# Patient Record
Sex: Female | Born: 1955 | Race: White | Hispanic: No | Marital: Single | Smoking: Current every day smoker
Health system: Southern US, Community
[De-identification: ages and names within clinical notes are randomized; demographics above are authoritative.]

## PROBLEM LIST (undated history)

## (undated) DIAGNOSIS — M797 Fibromyalgia: Secondary | ICD-10-CM

## (undated) DIAGNOSIS — Z8701 Personal history of pneumonia (recurrent): Secondary | ICD-10-CM

## (undated) DIAGNOSIS — G4700 Insomnia, unspecified: Secondary | ICD-10-CM

## (undated) DIAGNOSIS — F419 Anxiety disorder, unspecified: Secondary | ICD-10-CM

## (undated) DIAGNOSIS — E273 Drug-induced adrenocortical insufficiency: Secondary | ICD-10-CM

## (undated) DIAGNOSIS — K219 Gastro-esophageal reflux disease without esophagitis: Secondary | ICD-10-CM

## (undated) DIAGNOSIS — E039 Hypothyroidism, unspecified: Secondary | ICD-10-CM

## (undated) DIAGNOSIS — E119 Type 2 diabetes mellitus without complications: Secondary | ICD-10-CM

## (undated) DIAGNOSIS — E785 Hyperlipidemia, unspecified: Secondary | ICD-10-CM

## (undated) DIAGNOSIS — I10 Essential (primary) hypertension: Secondary | ICD-10-CM

## (undated) DIAGNOSIS — I2510 Atherosclerotic heart disease of native coronary artery without angina pectoris: Secondary | ICD-10-CM

---

## 1999-02-28 ENCOUNTER — Ambulatory Visit (HOSPITAL_COMMUNITY): Admission: RE | Admit: 1999-02-28 | Discharge: 1999-02-28 | Payer: Self-pay

## 2008-03-07 ENCOUNTER — Encounter: Admission: RE | Admit: 2008-03-07 | Discharge: 2008-03-07 | Payer: Self-pay | Admitting: Family Medicine

## 2008-03-09 ENCOUNTER — Ambulatory Visit (HOSPITAL_COMMUNITY): Admission: RE | Admit: 2008-03-09 | Discharge: 2008-03-09 | Payer: Self-pay | Admitting: Family Medicine

## 2011-01-11 ENCOUNTER — Encounter: Payer: Self-pay | Admitting: Family Medicine

## 2017-02-01 DIAGNOSIS — I10 Essential (primary) hypertension: Secondary | ICD-10-CM | POA: Diagnosis present

## 2017-02-01 DIAGNOSIS — F411 Generalized anxiety disorder: Secondary | ICD-10-CM | POA: Diagnosis present

## 2017-02-01 DIAGNOSIS — E1169 Type 2 diabetes mellitus with other specified complication: Secondary | ICD-10-CM | POA: Diagnosis present

## 2017-03-15 DIAGNOSIS — I739 Peripheral vascular disease, unspecified: Secondary | ICD-10-CM | POA: Diagnosis present

## 2017-04-19 DIAGNOSIS — K219 Gastro-esophageal reflux disease without esophagitis: Secondary | ICD-10-CM

## 2017-05-03 DIAGNOSIS — F5101 Primary insomnia: Secondary | ICD-10-CM | POA: Diagnosis present

## 2017-05-24 ENCOUNTER — Other Ambulatory Visit (HOSPITAL_COMMUNITY): Payer: Self-pay | Admitting: Adult Health Nurse Practitioner

## 2017-05-24 DIAGNOSIS — Z85820 Personal history of malignant melanoma of skin: Secondary | ICD-10-CM | POA: Insufficient documentation

## 2017-05-24 DIAGNOSIS — Z1231 Encounter for screening mammogram for malignant neoplasm of breast: Secondary | ICD-10-CM

## 2017-06-07 ENCOUNTER — Encounter (HOSPITAL_COMMUNITY): Payer: Self-pay

## 2017-06-07 ENCOUNTER — Ambulatory Visit (HOSPITAL_COMMUNITY): Payer: Self-pay

## 2017-09-28 DIAGNOSIS — E785 Hyperlipidemia, unspecified: Secondary | ICD-10-CM | POA: Diagnosis present

## 2017-12-21 DIAGNOSIS — C7951 Secondary malignant neoplasm of bone: Secondary | ICD-10-CM

## 2017-12-21 HISTORY — DX: Secondary malignant neoplasm of bone: C79.51

## 2018-10-06 DIAGNOSIS — C438 Malignant melanoma of overlapping sites of skin: Secondary | ICD-10-CM | POA: Diagnosis present

## 2019-09-08 DIAGNOSIS — E032 Hypothyroidism due to medicaments and other exogenous substances: Secondary | ICD-10-CM | POA: Diagnosis present

## 2020-06-29 DIAGNOSIS — E2740 Unspecified adrenocortical insufficiency: Secondary | ICD-10-CM | POA: Diagnosis present

## 2020-12-21 DIAGNOSIS — I739 Peripheral vascular disease, unspecified: Secondary | ICD-10-CM

## 2020-12-21 DIAGNOSIS — C801 Malignant (primary) neoplasm, unspecified: Secondary | ICD-10-CM

## 2020-12-21 HISTORY — DX: Peripheral vascular disease, unspecified: I73.9

## 2020-12-21 HISTORY — DX: Malignant (primary) neoplasm, unspecified: C80.1

## 2020-12-21 HISTORY — PX: FEMORAL ENDARTERECTOMY: SUR606

## 2021-04-10 DIAGNOSIS — C801 Malignant (primary) neoplasm, unspecified: Secondary | ICD-10-CM | POA: Diagnosis present

## 2021-04-14 ENCOUNTER — Inpatient Hospital Stay (HOSPITAL_COMMUNITY)
Admission: EM | Admit: 2021-04-14 | Discharge: 2021-04-20 | DRG: 299 | Disposition: E | Payer: Medicare HMO | Attending: Internal Medicine | Admitting: Internal Medicine

## 2021-04-14 ENCOUNTER — Emergency Department (HOSPITAL_COMMUNITY): Payer: Medicare HMO

## 2021-04-14 ENCOUNTER — Other Ambulatory Visit: Payer: Self-pay

## 2021-04-14 ENCOUNTER — Encounter (HOSPITAL_COMMUNITY): Payer: Self-pay

## 2021-04-14 DIAGNOSIS — Z20822 Contact with and (suspected) exposure to covid-19: Secondary | ICD-10-CM | POA: Diagnosis present

## 2021-04-14 DIAGNOSIS — E872 Acidosis: Secondary | ICD-10-CM | POA: Diagnosis present

## 2021-04-14 DIAGNOSIS — R54 Age-related physical debility: Secondary | ICD-10-CM | POA: Diagnosis present

## 2021-04-14 DIAGNOSIS — C7951 Secondary malignant neoplasm of bone: Secondary | ICD-10-CM | POA: Diagnosis present

## 2021-04-14 DIAGNOSIS — E2740 Unspecified adrenocortical insufficiency: Secondary | ICD-10-CM | POA: Diagnosis present

## 2021-04-14 DIAGNOSIS — I10 Essential (primary) hypertension: Secondary | ICD-10-CM | POA: Diagnosis present

## 2021-04-14 DIAGNOSIS — I2510 Atherosclerotic heart disease of native coronary artery without angina pectoris: Secondary | ICD-10-CM | POA: Diagnosis present

## 2021-04-14 DIAGNOSIS — I447 Left bundle-branch block, unspecified: Secondary | ICD-10-CM | POA: Diagnosis present

## 2021-04-14 DIAGNOSIS — G9341 Metabolic encephalopathy: Secondary | ICD-10-CM | POA: Diagnosis present

## 2021-04-14 DIAGNOSIS — Z6821 Body mass index (BMI) 21.0-21.9, adult: Secondary | ICD-10-CM

## 2021-04-14 DIAGNOSIS — F5101 Primary insomnia: Secondary | ICD-10-CM | POA: Diagnosis present

## 2021-04-14 DIAGNOSIS — M797 Fibromyalgia: Secondary | ICD-10-CM | POA: Diagnosis present

## 2021-04-14 DIAGNOSIS — I82401 Acute embolism and thrombosis of unspecified deep veins of right lower extremity: Secondary | ICD-10-CM | POA: Diagnosis present

## 2021-04-14 DIAGNOSIS — R7401 Elevation of levels of liver transaminase levels: Secondary | ICD-10-CM

## 2021-04-14 DIAGNOSIS — Z66 Do not resuscitate: Secondary | ICD-10-CM

## 2021-04-14 DIAGNOSIS — Z7952 Long term (current) use of systemic steroids: Secondary | ICD-10-CM

## 2021-04-14 DIAGNOSIS — E1151 Type 2 diabetes mellitus with diabetic peripheral angiopathy without gangrene: Secondary | ICD-10-CM | POA: Diagnosis not present

## 2021-04-14 DIAGNOSIS — F411 Generalized anxiety disorder: Secondary | ICD-10-CM | POA: Diagnosis present

## 2021-04-14 DIAGNOSIS — I82431 Acute embolism and thrombosis of right popliteal vein: Secondary | ICD-10-CM | POA: Diagnosis present

## 2021-04-14 DIAGNOSIS — Z86718 Personal history of other venous thrombosis and embolism: Secondary | ICD-10-CM

## 2021-04-14 DIAGNOSIS — K7200 Acute and subacute hepatic failure without coma: Secondary | ICD-10-CM | POA: Diagnosis present

## 2021-04-14 DIAGNOSIS — Z7189 Other specified counseling: Secondary | ICD-10-CM | POA: Diagnosis not present

## 2021-04-14 DIAGNOSIS — I739 Peripheral vascular disease, unspecified: Secondary | ICD-10-CM | POA: Diagnosis present

## 2021-04-14 DIAGNOSIS — D729 Disorder of white blood cells, unspecified: Secondary | ICD-10-CM | POA: Diagnosis not present

## 2021-04-14 DIAGNOSIS — C787 Secondary malignant neoplasm of liver and intrahepatic bile duct: Secondary | ICD-10-CM | POA: Diagnosis present

## 2021-04-14 DIAGNOSIS — I998 Other disorder of circulatory system: Secondary | ICD-10-CM | POA: Diagnosis present

## 2021-04-14 DIAGNOSIS — A419 Sepsis, unspecified organism: Secondary | ICD-10-CM | POA: Diagnosis not present

## 2021-04-14 DIAGNOSIS — C7801 Secondary malignant neoplasm of right lung: Secondary | ICD-10-CM | POA: Diagnosis present

## 2021-04-14 DIAGNOSIS — C761 Malignant neoplasm of thorax: Secondary | ICD-10-CM | POA: Diagnosis present

## 2021-04-14 DIAGNOSIS — K219 Gastro-esophageal reflux disease without esophagitis: Secondary | ICD-10-CM

## 2021-04-14 DIAGNOSIS — Z8249 Family history of ischemic heart disease and other diseases of the circulatory system: Secondary | ICD-10-CM

## 2021-04-14 DIAGNOSIS — Z7984 Long term (current) use of oral hypoglycemic drugs: Secondary | ICD-10-CM

## 2021-04-14 DIAGNOSIS — N179 Acute kidney failure, unspecified: Secondary | ICD-10-CM | POA: Diagnosis present

## 2021-04-14 DIAGNOSIS — Z9114 Patient's other noncompliance with medication regimen: Secondary | ICD-10-CM

## 2021-04-14 DIAGNOSIS — E032 Hypothyroidism due to medicaments and other exogenous substances: Secondary | ICD-10-CM | POA: Diagnosis present

## 2021-04-14 DIAGNOSIS — R6520 Severe sepsis without septic shock: Secondary | ICD-10-CM | POA: Diagnosis not present

## 2021-04-14 DIAGNOSIS — R52 Pain, unspecified: Secondary | ICD-10-CM

## 2021-04-14 DIAGNOSIS — Z87891 Personal history of nicotine dependence: Secondary | ICD-10-CM

## 2021-04-14 DIAGNOSIS — D6869 Other thrombophilia: Secondary | ICD-10-CM | POA: Diagnosis present

## 2021-04-14 DIAGNOSIS — D72828 Other elevated white blood cell count: Secondary | ICD-10-CM

## 2021-04-14 DIAGNOSIS — Z79890 Hormone replacement therapy (postmenopausal): Secondary | ICD-10-CM

## 2021-04-14 DIAGNOSIS — Z8371 Family history of colonic polyps: Secondary | ICD-10-CM

## 2021-04-14 DIAGNOSIS — Z885 Allergy status to narcotic agent status: Secondary | ICD-10-CM

## 2021-04-14 DIAGNOSIS — C7802 Secondary malignant neoplasm of left lung: Secondary | ICD-10-CM | POA: Diagnosis present

## 2021-04-14 DIAGNOSIS — X58XXXA Exposure to other specified factors, initial encounter: Secondary | ICD-10-CM | POA: Diagnosis present

## 2021-04-14 DIAGNOSIS — E785 Hyperlipidemia, unspecified: Secondary | ICD-10-CM | POA: Diagnosis present

## 2021-04-14 DIAGNOSIS — Z515 Encounter for palliative care: Secondary | ICD-10-CM

## 2021-04-14 DIAGNOSIS — T451X5A Adverse effect of antineoplastic and immunosuppressive drugs, initial encounter: Secondary | ICD-10-CM | POA: Diagnosis present

## 2021-04-14 DIAGNOSIS — Z823 Family history of stroke: Secondary | ICD-10-CM

## 2021-04-14 DIAGNOSIS — E860 Dehydration: Secondary | ICD-10-CM | POA: Diagnosis present

## 2021-04-14 DIAGNOSIS — E441 Mild protein-calorie malnutrition: Secondary | ICD-10-CM | POA: Diagnosis present

## 2021-04-14 DIAGNOSIS — G893 Neoplasm related pain (acute) (chronic): Secondary | ICD-10-CM | POA: Diagnosis present

## 2021-04-14 DIAGNOSIS — Z79899 Other long term (current) drug therapy: Secondary | ICD-10-CM

## 2021-04-14 DIAGNOSIS — E1169 Type 2 diabetes mellitus with other specified complication: Secondary | ICD-10-CM | POA: Diagnosis not present

## 2021-04-14 DIAGNOSIS — C801 Malignant (primary) neoplasm, unspecified: Secondary | ICD-10-CM | POA: Diagnosis not present

## 2021-04-14 DIAGNOSIS — Z888 Allergy status to other drugs, medicaments and biological substances status: Secondary | ICD-10-CM

## 2021-04-14 DIAGNOSIS — Z8042 Family history of malignant neoplasm of prostate: Secondary | ICD-10-CM

## 2021-04-14 DIAGNOSIS — Z7902 Long term (current) use of antithrombotics/antiplatelets: Secondary | ICD-10-CM

## 2021-04-14 DIAGNOSIS — Z90710 Acquired absence of both cervix and uterus: Secondary | ICD-10-CM

## 2021-04-14 DIAGNOSIS — I82411 Acute embolism and thrombosis of right femoral vein: Secondary | ICD-10-CM | POA: Diagnosis present

## 2021-04-14 DIAGNOSIS — C438 Malignant melanoma of overlapping sites of skin: Secondary | ICD-10-CM | POA: Diagnosis present

## 2021-04-14 DIAGNOSIS — Z85820 Personal history of malignant melanoma of skin: Secondary | ICD-10-CM

## 2021-04-14 HISTORY — DX: Personal history of pneumonia (recurrent): Z87.01

## 2021-04-14 HISTORY — DX: Hyperlipidemia, unspecified: E78.5

## 2021-04-14 HISTORY — DX: Essential (primary) hypertension: I10

## 2021-04-14 HISTORY — DX: Gastro-esophageal reflux disease without esophagitis: K21.9

## 2021-04-14 HISTORY — DX: Hypothyroidism, unspecified: E03.9

## 2021-04-14 HISTORY — DX: Drug-induced adrenocortical insufficiency: E27.3

## 2021-04-14 HISTORY — DX: Insomnia, unspecified: G47.00

## 2021-04-14 HISTORY — DX: Atherosclerotic heart disease of native coronary artery without angina pectoris: I25.10

## 2021-04-14 HISTORY — DX: Type 2 diabetes mellitus without complications: E11.9

## 2021-04-14 HISTORY — DX: Anxiety disorder, unspecified: F41.9

## 2021-04-14 HISTORY — DX: Fibromyalgia: M79.7

## 2021-04-14 LAB — CBC WITH DIFFERENTIAL/PLATELET
Abs Immature Granulocytes: 0.41 10*3/uL — ABNORMAL HIGH (ref 0.00–0.07)
Basophils Absolute: 0.1 10*3/uL (ref 0.0–0.1)
Basophils Relative: 0 %
Eosinophils Absolute: 0.1 10*3/uL (ref 0.0–0.5)
Eosinophils Relative: 0 %
HCT: 49.4 % — ABNORMAL HIGH (ref 36.0–46.0)
Hemoglobin: 16.1 g/dL — ABNORMAL HIGH (ref 12.0–15.0)
Immature Granulocytes: 2 %
Lymphocytes Relative: 3 %
Lymphs Abs: 0.6 10*3/uL — ABNORMAL LOW (ref 0.7–4.0)
MCH: 29.2 pg (ref 26.0–34.0)
MCHC: 32.6 g/dL (ref 30.0–36.0)
MCV: 89.7 fL (ref 80.0–100.0)
Monocytes Absolute: 1 10*3/uL (ref 0.1–1.0)
Monocytes Relative: 4 %
Neutro Abs: 20.5 10*3/uL — ABNORMAL HIGH (ref 1.7–7.7)
Neutrophils Relative %: 91 %
Platelets: 166 10*3/uL (ref 150–400)
RBC: 5.51 MIL/uL — ABNORMAL HIGH (ref 3.87–5.11)
RDW: 18 % — ABNORMAL HIGH (ref 11.5–15.5)
WBC: 22.7 10*3/uL — ABNORMAL HIGH (ref 4.0–10.5)
nRBC: 1 % — ABNORMAL HIGH (ref 0.0–0.2)

## 2021-04-14 LAB — URINALYSIS, ROUTINE W REFLEX MICROSCOPIC
Bilirubin Urine: NEGATIVE
Glucose, UA: 150 mg/dL — AB
Ketones, ur: NEGATIVE mg/dL
Leukocytes,Ua: NEGATIVE
Nitrite: NEGATIVE
Protein, ur: NEGATIVE mg/dL
Specific Gravity, Urine: 1.017 (ref 1.005–1.030)
pH: 5 (ref 5.0–8.0)

## 2021-04-14 LAB — LIPASE, BLOOD: Lipase: 32 U/L (ref 11–51)

## 2021-04-14 LAB — RESP PANEL BY RT-PCR (FLU A&B, COVID) ARPGX2
Influenza A by PCR: NEGATIVE
Influenza B by PCR: NEGATIVE
SARS Coronavirus 2 by RT PCR: NEGATIVE

## 2021-04-14 LAB — COMPREHENSIVE METABOLIC PANEL
ALT: 569 U/L — ABNORMAL HIGH (ref 0–44)
AST: 1827 U/L — ABNORMAL HIGH (ref 15–41)
Albumin: 2.6 g/dL — ABNORMAL LOW (ref 3.5–5.0)
Alkaline Phosphatase: 978 U/L — ABNORMAL HIGH (ref 38–126)
Anion gap: 23 — ABNORMAL HIGH (ref 5–15)
BUN: 73 mg/dL — ABNORMAL HIGH (ref 8–23)
CO2: 12 mmol/L — ABNORMAL LOW (ref 22–32)
Calcium: 8.4 mg/dL — ABNORMAL LOW (ref 8.9–10.3)
Chloride: 95 mmol/L — ABNORMAL LOW (ref 98–111)
Creatinine, Ser: 1.45 mg/dL — ABNORMAL HIGH (ref 0.44–1.00)
GFR, Estimated: 40 mL/min — ABNORMAL LOW (ref >60)
Glucose, Bld: 171 mg/dL — ABNORMAL HIGH (ref 70–99)
Potassium: 4.9 mmol/L (ref 3.5–5.1)
Sodium: 130 mmol/L — ABNORMAL LOW (ref 135–145)
Total Bilirubin: 5.4 mg/dL — ABNORMAL HIGH (ref 0.3–1.2)
Total Protein: 5 g/dL — ABNORMAL LOW (ref 6.5–8.1)

## 2021-04-14 LAB — MRSA PCR SCREENING: MRSA by PCR: NEGATIVE

## 2021-04-14 LAB — HEPARIN LEVEL (UNFRACTIONATED): Heparin Unfractionated: <0.1 IU/mL — ABNORMAL LOW (ref 0.30–0.70)

## 2021-04-14 LAB — BLOOD GAS, VENOUS
Acid-base deficit: 11 mmol/L — ABNORMAL HIGH (ref 0.0–2.0)
Bicarbonate: 16.4 mmol/L — ABNORMAL LOW (ref 20.0–28.0)
FIO2: 21
O2 Saturation: 75.8 %
Patient temperature: 36.9
pCO2, Ven: 24.6 mmHg — ABNORMAL LOW (ref 44.0–60.0)
pH, Ven: 7.357 (ref 7.250–7.430)
pO2, Ven: 49 mmHg — ABNORMAL HIGH (ref 32.0–45.0)

## 2021-04-14 LAB — GLUCOSE, CAPILLARY: Glucose-Capillary: 136 mg/dL — ABNORMAL HIGH (ref 70–99)

## 2021-04-14 LAB — PROTIME-INR
INR: 1.4 — ABNORMAL HIGH (ref 0.8–1.2)
Prothrombin Time: 16.9 seconds — ABNORMAL HIGH (ref 11.4–15.2)

## 2021-04-14 LAB — APTT: aPTT: 27 seconds (ref 24–36)

## 2021-04-14 LAB — TSH: TSH: 4.583 u[IU]/mL — ABNORMAL HIGH (ref 0.350–4.500)

## 2021-04-14 LAB — LACTIC ACID, PLASMA
Lactic Acid, Venous: 8.8 mmol/L (ref 0.5–1.9)
Lactic Acid, Venous: 8.1 mmol/L (ref 0.5–1.9)

## 2021-04-14 MED ORDER — SUCRALFATE 1 G PO TABS
1.0000 g | ORAL_TABLET | Freq: Every day | ORAL | Status: DC
  Filled 2021-04-14: qty 1

## 2021-04-14 MED ORDER — ACETAMINOPHEN 650 MG RE SUPP: 650.0000 mg | Freq: Four times a day (QID) | RECTAL | Status: DC | PRN

## 2021-04-14 MED ORDER — VANCOMYCIN HCL IN DEXTROSE 1-5 GM/200ML-% IV SOLN: 1000.0000 mg | Freq: Once | INTRAVENOUS | Status: DC

## 2021-04-14 MED ORDER — PANTOPRAZOLE SODIUM 40 MG PO TBEC
40.0000 mg | DELAYED_RELEASE_TABLET | Freq: Every day | ORAL | Status: DC
  Administered 2021-04-15: 40 mg via ORAL
  Filled 2021-04-14: qty 1

## 2021-04-14 MED ORDER — METRONIDAZOLE IN NACL 5-0.79 MG/ML-% IV SOLN
500.0000 mg | Freq: Once | INTRAVENOUS | Status: AC
  Administered 2021-04-14: 500 mg via INTRAVENOUS
  Filled 2021-04-14: qty 100

## 2021-04-14 MED ORDER — METOPROLOL TARTRATE 25 MG PO TABS
12.5000 mg | ORAL_TABLET | Freq: Two times a day (BID) | ORAL | Status: DC
  Administered 2021-04-14: 12.5 mg via ORAL
  Filled 2021-04-14: qty 1

## 2021-04-14 MED ORDER — HEPARIN (PORCINE) 25000 UT/250ML-% IV SOLN
1000.0000 [IU]/h | INTRAVENOUS | Status: DC
  Administered 2021-04-14 (×2): 850 [IU]/h via INTRAVENOUS
  Filled 2021-04-14 (×2): qty 250

## 2021-04-14 MED ORDER — ENSURE ENLIVE PO LIQD: 237.0000 mL | Freq: Two times a day (BID) | ORAL | Status: DC

## 2021-04-14 MED ORDER — SENNA 8.6 MG PO TABS
1.0000 | ORAL_TABLET | Freq: Two times a day (BID) | ORAL | Status: DC
  Administered 2021-04-14: 8.6 mg via ORAL
  Filled 2021-04-14: qty 1

## 2021-04-14 MED ORDER — HYDROCORTISONE NA SUCCINATE PF 100 MG IJ SOLR
50.0000 mg | Freq: Four times a day (QID) | INTRAMUSCULAR | Status: DC
  Administered 2021-04-14 – 2021-04-15 (×2): 50 mg via INTRAVENOUS
  Filled 2021-04-14 (×2): qty 2

## 2021-04-14 MED ORDER — LACTATED RINGERS IV BOLUS (SEPSIS): 500.0000 mL | Freq: Once | INTRAVENOUS | Status: DC

## 2021-04-14 MED ORDER — HEPARIN BOLUS VIA INFUSION
3000.0000 [IU] | Freq: Once | INTRAVENOUS | Status: AC
  Administered 2021-04-14: 3000 [IU] via INTRAVENOUS

## 2021-04-14 MED ORDER — ONDANSETRON HCL 4 MG PO TABS: 4.0000 mg | ORAL_TABLET | Freq: Three times a day (TID) | ORAL | Status: DC | PRN

## 2021-04-14 MED ORDER — NYSTATIN 100000 UNIT/ML MT SUSP
4.0000 mL | Freq: Four times a day (QID) | OROMUCOSAL | Status: DC
  Administered 2021-04-14 – 2021-04-15 (×2): 400000 [IU] via OROMUCOSAL
  Filled 2021-04-14 (×2): qty 5

## 2021-04-14 MED ORDER — PREDNISONE 10 MG PO TABS: 10.0000 mg | ORAL_TABLET | Freq: Every day | ORAL | Status: DC

## 2021-04-14 MED ORDER — SODIUM CHLORIDE 0.9 % IV SOLN
2.0000 g | Freq: Once | INTRAVENOUS | Status: AC
  Administered 2021-04-14: 2 g via INTRAVENOUS
  Filled 2021-04-14: qty 2

## 2021-04-14 MED ORDER — LISINOPRIL 10 MG PO TABS: 10.0000 mg | ORAL_TABLET | Freq: Every day | ORAL | Status: DC

## 2021-04-14 MED ORDER — LACTATED RINGERS IV BOLUS (SEPSIS)
1000.0000 mL | Freq: Once | INTRAVENOUS | Status: AC
  Administered 2021-04-14: 1000 mL via INTRAVENOUS

## 2021-04-14 MED ORDER — VANCOMYCIN HCL IN DEXTROSE 1-5 GM/200ML-% IV SOLN
1000.0000 mg | Freq: Once | INTRAVENOUS | Status: AC
  Administered 2021-04-14: 1000 mg via INTRAVENOUS
  Filled 2021-04-14: qty 200

## 2021-04-14 MED ORDER — SODIUM CHLORIDE 0.9 % IV SOLN
2.0000 g | Freq: Two times a day (BID) | INTRAVENOUS | Status: DC
  Administered 2021-04-15: 2 g via INTRAVENOUS
  Filled 2021-04-14: qty 2

## 2021-04-14 MED ORDER — FENTANYL 50 MCG/HR TD PT72
1.0000 | MEDICATED_PATCH | TRANSDERMAL | Status: DC
  Administered 2021-04-14: 1 via TRANSDERMAL
  Filled 2021-04-14: qty 1

## 2021-04-14 MED ORDER — IOHEXOL 350 MG/ML SOLN
75.0000 mL | Freq: Once | INTRAVENOUS | Status: AC | PRN
  Administered 2021-04-14: 75 mL via INTRAVENOUS

## 2021-04-14 MED ORDER — SODIUM CHLORIDE 0.9 % IV SOLN: 2.0000 g | Freq: Once | INTRAVENOUS | Status: DC

## 2021-04-14 MED ORDER — ACETAMINOPHEN 325 MG PO TABS: 650.0000 mg | ORAL_TABLET | Freq: Four times a day (QID) | ORAL | Status: DC | PRN

## 2021-04-14 MED ORDER — LACTATED RINGERS IV BOLUS (SEPSIS)
500.0000 mL | Freq: Once | INTRAVENOUS | Status: AC
  Administered 2021-04-14: 500 mL via INTRAVENOUS

## 2021-04-14 MED ORDER — SODIUM CHLORIDE 0.9 % IV SOLN: Freq: Once | INTRAVENOUS | Status: AC

## 2021-04-14 MED ORDER — HYDROCORTISONE 10 MG PO TABS: 10.0000 mg | ORAL_TABLET | ORAL | Status: DC

## 2021-04-14 MED ORDER — METRONIDAZOLE IN NACL 5-0.79 MG/ML-% IV SOLN
500.0000 mg | Freq: Three times a day (TID) | INTRAVENOUS | Status: DC
  Administered 2021-04-15 (×2): 500 mg via INTRAVENOUS
  Filled 2021-04-14 (×2): qty 100

## 2021-04-14 MED ORDER — LACTATED RINGERS IV SOLN: INTRAVENOUS | Status: DC

## 2021-04-14 MED ORDER — LACTATED RINGERS IV BOLUS (SEPSIS)
250.0000 mL | Freq: Once | INTRAVENOUS | Status: AC
  Administered 2021-04-14: 250 mL via INTRAVENOUS

## 2021-04-14 MED ORDER — SODIUM CHLORIDE 0.45 % IV SOLN: INTRAVENOUS | Status: DC

## 2021-04-14 MED ORDER — INSULIN ASPART 100 UNIT/ML ~~LOC~~ SOLN
0.0000 [IU] | Freq: Three times a day (TID) | SUBCUTANEOUS | Status: DC
  Administered 2021-04-15: 2 [IU] via SUBCUTANEOUS

## 2021-04-14 MED ORDER — OXYCODONE HCL 5 MG PO TABS: 5.0000 mg | ORAL_TABLET | ORAL | Status: DC | PRN

## 2021-04-14 MED ORDER — CYCLOBENZAPRINE HCL 10 MG PO TABS: 10.0000 mg | ORAL_TABLET | Freq: Two times a day (BID) | ORAL | Status: DC | PRN

## 2021-04-14 MED ORDER — VANCOMYCIN HCL 500 MG/100ML IV SOLN: 500.0000 mg | INTRAVENOUS | Status: DC

## 2021-04-14 MED ORDER — LEVOTHYROXINE SODIUM 25 MCG PO TABS
25.0000 ug | ORAL_TABLET | Freq: Every day | ORAL | Status: DC
  Administered 2021-04-15: 25 ug via ORAL
  Filled 2021-04-14: qty 1

## 2021-04-14 MED ORDER — ALPRAZOLAM 0.5 MG PO TABS
1.0000 mg | ORAL_TABLET | Freq: Two times a day (BID) | ORAL | Status: DC | PRN
  Administered 2021-04-14: 1 mg via ORAL
  Filled 2021-04-14: qty 2

## 2021-04-14 MED ORDER — CYCLOSPORINE 0.05 % OP EMUL
1.0000 [drp] | Freq: Two times a day (BID) | OPHTHALMIC | Status: DC
  Administered 2021-04-14 – 2021-04-15 (×2): 1 [drp] via OPHTHALMIC
  Filled 2021-04-14 (×2): qty 1

## 2021-04-14 NOTE — ED Notes (Signed)
To CT at this time.

## 2021-04-14 NOTE — ED Notes (Signed)
Patient returned to room from CT at this time.

## 2021-04-14 NOTE — Progress Notes (Signed)
Pharmacy Antibiotic Note  Ebony Gallagher is a 65 y.o. female admitted on 03/26/2021 with unknown source of infection.  Pharmacy has been consulted for Vancomycin and cefepime dosing.  Plan: Vancomycin 1000 mg IV x 1 dose. Vancomycin 500 mg IV every 24 hours. Cefepime 2000 mg IV every 12 hours. Monitor labs, c/s, and vanco level as indicated.  Height: 5\' 6"  (167.6 cm) Weight: 52.2 kg (115 lb) IBW/kg (Calculated) : 59.3  Temp (24hrs), Avg:98.1 F (36.7 C), Min:97.7 F (36.5 C), Max:98.4 F (36.9 C)  Recent Labs  Lab 04/05/2021 1433  WBC 22.7*  CREATININE 1.45*  LATICACIDVEN 8.8*    Estimated Creatinine Clearance: 31.5 mL/min (A) (by C-G formula based on SCr of 1.45 mg/dL (H)).    No Known Allergies  Antimicrobials this admission: Vanco 4/25 >>  Cefepime 4/25 >>    Microbiology results: 4/25 BCx: pending 4/25 UCx: pending    Thank you for allowing pharmacy to be a part of this patient's care.  Ramond Craver 04/09/2021 4:24 PM

## 2021-04-14 NOTE — ED Provider Notes (Signed)
[facility] EMERGENCY DEPARTMENT Provider Note   CSN: [id number] Arrival date & time: 2021-04-19  1358     History Chief Complaint  Patient presents with  . Leg Pain    Ebony Gallagher is a 65 y.o. female presenting for evaluation of pain.  Level 5 caveat due to altered mental status.  Patient states she is here because she is having generalized pain.  She states this is not new.  She reports leg pain and swelling, which is also not new.  Additional history obtained from EMS.  Per EMS, family called due to right lower leg swelling and pain.  Concern for patient's medication compliance.  Once daughter arrived, she was able to provide more history.  Patient is currently undergoing investigation for possible metastatic cancer with likely source being the pancreas.  She has been altered for several weeks, this is gradually worsening.  Leg pain and swelling is new.  Patient lives with a roommate, daughter is nearby and involved in her care.  She reports patient has been noncompliant with medications, has not been eating and drinking very much.  Reports very quick decline of patient's health.  HPI     Past Medical History:  Diagnosis Date  . Cancer ([place])     There are no problems to display for this patient.   History reviewed. No pertinent surgical history.   OB History   No obstetric history on file.     History reviewed. No pertinent family history.  Social History   Tobacco Use  . Smoking status: Current Some Day Smoker    Types: Cigarettes  . Smokeless tobacco: Never Used  Substance Use Topics  . Alcohol use: Not Currently    Comment: Stopped drinking 6 months ago.  . Drug use: Never    Home Medications Prior to Admission medications   Not on File    Allergies    Patient has no known allergies.  Review of Systems   Review of Systems  Unable to perform ROS: Mental status change  Cardiovascular: Positive for leg swelling.  Musculoskeletal: Positive for  myalgias.    Physical Exam Updated Vital Signs BP 112/74   Pulse (!) 108   Temp 98.4 F (36.9 C) (Rectal)   Resp (!) 37   Ht 5\' 6"  (1.676 m)   Wt 52.2 kg   SpO2 96%   BMI 18.56 kg/m   Physical Exam Vitals and nursing note reviewed.  Constitutional:      General: She is not in acute distress.    Appearance: She is underweight. She is ill-appearing and toxic-appearing.     Comments: Patient appears ill  HENT:     Head: Normocephalic and atraumatic.  Eyes:     Extraocular Movements: Extraocular movements intact.     Conjunctiva/sclera: Conjunctivae normal.     Pupils: Pupils are equal, round, and reactive to light.  Cardiovascular:     Rate and Rhythm: Regular rhythm. Tachycardia present.     Pulses: Normal pulses.     Comments: Tachycardic around 115 Pulmonary:     Effort: Pulmonary effort is normal. No respiratory distress.     Breath sounds: Normal breath sounds. No wheezing.     Comments: Tachypneic at 35-40.  Speaking in short sentences.  Clear lung sounds. Abdominal:     General: There is no distension.     Palpations: Abdomen is soft. There is no mass.     Tenderness: There is abdominal tenderness. There is no guarding or rebound.  Comments: Diffuse tenderness palpation of the abdomen  Musculoskeletal:        General: Normal range of motion.     Cervical back: Normal range of motion and neck supple.     Right lower leg: Edema present.     Comments: Right lower extremity with swelling and tenderness.  Skin:    General: Skin is warm and dry.     Capillary Refill: Capillary refill takes less than 2 seconds.  Neurological:     Mental Status: She is alert.     Comments: Oriented to person and place     ED Results / Procedures / Treatments   Labs (all labs ordered are listed, but only abnormal results are displayed) Labs Reviewed  LACTIC ACID, PLASMA - Abnormal; Notable for the following components:      Result Value   Lactic Acid, Venous 8.8 (*)    All  other components within normal limits  COMPREHENSIVE METABOLIC PANEL - Abnormal; Notable for the following components:   Sodium 130 (*)    Chloride 95 (*)    CO2 12 (*)    Glucose, Bld 171 (*)    BUN 73 (*)    Creatinine, Ser 1.45 (*)    Calcium 8.4 (*)    Total Protein 5.0 (*)    Albumin 2.6 (*)    AST 1,827 (*)    ALT 569 (*)    Alkaline Phosphatase 978 (*)    Total Bilirubin 5.4 (*)    GFR, Estimated 40 (*)    Anion gap 23 (*)    All other components within normal limits  CBC WITH DIFFERENTIAL/PLATELET - Abnormal; Notable for the following components:   WBC 22.7 (*)    RBC 5.51 (*)    Hemoglobin 16.1 (*)    HCT 49.4 (*)    RDW 18.0 (*)    nRBC 1.0 (*)    Neutro Abs 20.5 (*)    Lymphs Abs 0.6 (*)    Abs Immature Granulocytes 0.41 (*)    All other components within normal limits  PROTIME-INR - Abnormal; Notable for the following components:   Prothrombin Time 16.9 (*)    INR 1.4 (*)    All other components within normal limits  BLOOD GAS, VENOUS - Abnormal; Notable for the following components:   pCO2, Ven 24.6 (*)    pO2, Ven 49.0 (*)    Bicarbonate 16.4 (*)    Acid-base deficit 11.0 (*)    All other components within normal limits  CULTURE, BLOOD (ROUTINE X 2)  CULTURE, BLOOD (ROUTINE X 2)  RESP PANEL BY RT-PCR (FLU A&B, COVID) ARPGX2  URINE CULTURE  APTT  LIPASE, BLOOD  LACTIC ACID, PLASMA  URINALYSIS, ROUTINE W REFLEX MICROSCOPIC  HEPARIN LEVEL (UNFRACTIONATED)  CBC    EKG EKG Interpretation  Date/Time:  Monday April 14 2021 14:11:16 EDT Ventricular Rate:  117 PR Interval:  110 QRS Duration: 159 QT Interval:  286 QTC Calculation: 399 R Axis:   62 Text Interpretation: Sinus tachycardia Atrial premature complex Left bundle branch block Confirmed by Nanda Quinton [phone]) on 04/03/2021 2:34:37 PM   Radiology US Venous Img Lower Bilateral (DVT)  Result Date: 03/23/2021 CLINICAL DATA:  Pain and edema EXAM: BILATERAL LOWER EXTREMITY VENOUS DOPPLER  ULTRASOUND TECHNIQUE: Gray-scale sonography with compression, as well as color and duplex ultrasound, were performed to evaluate the deep venous system(s) from the level of the common femoral vein through the popliteal and proximal calf veins. COMPARISON:  None. FINDINGS: VENOUS  On the right, hypoechoic noncompressible occlusive thrombus in the deep femoral vein visualized segments in throughout the femoral vein and popliteal vein with minimal flow signal on color Doppler. Occluded peroneal, posterior tibial, and anterior tibial veins. The common femoral vein and saphenofemoral junction remain patent. On the left, normal compressibility of the common femoral, superficial femoral, and popliteal veins, as well as the visualized calf veins. Visualized portions of profunda femoral vein and great saphenous vein unremarkable. No filling defects to suggest DVT on grayscale or color Doppler imaging. Doppler waveforms show normal direction of venous flow, normal respiratory phasicity and response to augmentation. OTHER None. Limitations: none IMPRESSION: 1. POSITIVE for extensive right occlusive DVT involving calf veins, popliteal, femoral, and deep femoral veins. 2. No left lower extremity DVT. Electronically Signed   By: Lucrezia Europe M.D.   On: 04/13/2021 16:08   DG Chest Port 1 View  Result Date: 03/28/2021 CLINICAL DATA:  Sepsis. EXAM: PORTABLE CHEST 1 VIEW COMPARISON:  March 07, 2008. FINDINGS: The heart size and mediastinal contours are within normal limits. Right internal jugular Port-A-Cath is noted with tip in expected position of the SVC. No pneumothorax or pleural effusion is noted. Mild bibasilar subsegmental atelectasis is noted. The visualized skeletal structures are unremarkable. IMPRESSION: Mild bibasilar subsegmental atelectasis. Electronically Signed   By: Marijo Conception M.D.   On: 04/13/2021 15:04    Procedures .Critical Care Performed by: Franchot Heidelberg, PA-C Authorized by: Franchot Heidelberg,  PA-C   Critical care provider statement:    Critical care time (minutes):  60   Critical care time was exclusive of:  Separately billable procedures and treating other patients and teaching time   Critical care was necessary to treat or prevent imminent or life-threatening deterioration of the following conditions:  Sepsis, respiratory failure and hepatic failure   Critical care was time spent personally by me on the following activities:  Blood draw for specimens, development of treatment plan with patient or surrogate, evaluation of patient's response to treatment, examination of patient, obtaining history from patient or surrogate, ordering and performing treatments and interventions, ordering and review of radiographic studies, ordering and review of laboratory studies, pulse oximetry, re-evaluation of patient's condition and review of old charts   Care discussed with: admitting provider   Comments:     Concern for sepsis with liver dysfunction and altered mental status.  High suspicion for PE.  Patient admitted on broad-spectrum antibiotics, fluid resuscitated, and started on heparin drip.     Medications Ordered in ED Medications  lactated ringers infusion (has no administration in time range)  metroNIDAZOLE (FLAGYL) IVPB 500 mg (has no administration in time range)  vancomycin (VANCOCIN) IVPB 1000 mg/200 mL premix (1,000 mg Intravenous New Bag/Given 03/26/2021 1721)  lactated ringers bolus 1,000 mL (1,000 mLs Intravenous New Bag/Given 04/19/2021 1724)    And  lactated ringers bolus 250 mL (250 mLs Intravenous New Bag/Given 03/28/2021 1726)  vancomycin (VANCOREADY) IVPB 500 mg/100 mL (has no administration in time range)  ceFEPIme (MAXIPIME) 2 g in sodium chloride 0.9 % 100 mL IVPB (has no administration in time range)  heparin ADULT infusion 100 units/mL (25000 units/276mL) (850 Units/hr Intravenous New Bag/Given 04/07/2021 1716)  lactated ringers bolus 500 mL (0 mLs Intravenous Stopped 03/28/2021  1556)  ceFEPIme (MAXIPIME) 2 g in sodium chloride 0.9 % 100 mL IVPB (0 g Intravenous Stopped 04/07/2021 1710)  0.9 %  sodium chloride infusion ( Intravenous Stopped 04/13/2021 1728)  heparin bolus via infusion 3,000 Units (3,000 Units  Intravenous Bolus from Bag 04/01/2021 1716)    ED Course  I have reviewed the triage vital signs and the nursing notes.  Pertinent labs & imaging results that were available during my care of the patient were reviewed by me and considered in my medical decision making (see chart for details).    MDM Rules/Calculators/A&P                          Patient presenting for evaluation of right lower leg pain and swelling.  However on evaluation, patient is altered.  She is tachypneic and tachycardic.  She appears ill.  Concern for sepsis.  Also consider PE.  Consider worsening metastatic cancer.  Will obtain labs, chest x-ray, urine, DVT ultrasound.  Labs concerning for sepsis, patient has leukocytosis, elevated lactic, and signs of hepatic failure.  Code sepsis called and broad-spectrum antibiotics started.  Patient was appears dehydrated, 30 cc/kg fluid given.  Additionally, ultrasound positive for DVT.  This increases my suspicion for PE, especially in setting of cancer and likely noncompliance with Plavix.  CTA ordered and heparin started per IV.  Discussed findings with patient and daughter.  Discussed with patient and daughter that patient is critically ill.  Confirmed CODE STATUS, patient is full code.  Will call for admission.  Discussed with Dr. Linda Hedges from [facility] hospitalist service, patient to be admitted.  Final Clinical Impression(s) / ED Diagnoses Final diagnoses:  Severe sepsis with acute organ dysfunction (HCC)  Acute liver failure without hepatic coma  Dehydration  Acute deep vein thrombosis (DVT) of right lower extremity, unspecified vein [facility])    Rx / DC Orders ED Discharge Orders    None       Franchot Heidelberg, PA-C 04/01/2021 1749    Long,  [facility], MD 18-Apr-2021 [phone]

## 2021-04-14 NOTE — Progress Notes (Signed)
ANTICOAGULATION CONSULT NOTE - Follow Up Consult  Pharmacy Consult for IV Heparin Indication: DVT  Allergies  Allergen Reactions  . Codeine Rash  . Gabapentin     Makes pt feel funny    Patient Measurements: Height: 5\' 6"  (167.6 cm) Weight: 60.1 kg (132 lb 7.9 oz) IBW/kg (Calculated) : 59.3 Heparin Dosing Weight: 60 kg  Vital Signs: Temp: 97.6 F (36.4 C) (04/25 2121) Temp Source: Oral (04/25 2121) BP: 137/72 (04/25 1930) Pulse Rate: 108 (04/25 1930)  Labs: Recent Labs    04/13/2021 1433 04/18/2021 2045  HGB 16.1*  --   HCT 49.4*  --   PLT 166  --   APTT 27  --   LABPROT 16.9*  --   INR 1.4*  --   HEPARINUNFRC  --  <0.10*  CREATININE 1.45*  --     Estimated Creatinine Clearance: 35.7 mL/min (A) (by C-G formula based on SCr of 1.45 mg/dL (H)).   Medications:  Scheduled:  . cycloSPORINE  1 drop Both Eyes BID  . fentaNYL  1 patch Transdermal Q72H  . hydrocortisone sod succinate (SOLU-CORTEF) inj  50 mg Intravenous Q6H  . [START ON 05-07-2021] levothyroxine  25 mcg Oral QAC breakfast  . metoprolol tartrate  12.5 mg Oral BID  . nystatin  4 mL Mouth/Throat QID  . [START ON 05-07-21] pantoprazole  40 mg Oral Daily  . senna  1 tablet Oral BID  . [START ON May 07, 2021] sucralfate  1 g Oral Daily   Infusions:  . sodium chloride    . [START ON 05/07/21] ceFEPime (MAXIPIME) IV    . heparin 850 Units/hr (04/12/2021 1955)  . [START ON 2021-05-07] metronidazole    . [START ON 05/07/21] vancomycin      Assessment:  Patient is a 65 yo F who was diagnosed with DVT earlier this evening and started on heparin infusion.  Pt was transported to CT scan to eval for PE and upon return noted the entire heparin infusion was empty.  There was initial concern that the patient received the entire infusion as a bolus, however, it was noted that the IV line was torn and patient's bed linens were wet.  It is likely the IV line was torn and the infusion leaked into the sheets rather than  administered to the patient.  STAT heparin level confirms this theory as heparin level <0.1.  No bleeding noted.  Heparin resumed at previous rate of 850 units/hr.  Goal of Therapy:  Heparin level 0.3-0.7 units/ml Monitor platelets by anticoagulation protocol: Yes   Plan:  Continue heparin at 850 units/hr. Next heparin level 4/26 at 0600.  [employer], Pharm.D., BCPS Clinical Pharmacist  **Pharmacist phone directory can be found on [email or web address] listed under [place].  04/14/2021 9:39 PM

## 2021-04-14 NOTE — Progress Notes (Signed)
ANTICOAGULATION CONSULT NOTE - Initial Consult  Pharmacy Consult for heparin Indication: pulmonary embolus/DVT  No Known Allergies  Patient Measurements: Height: 5\' 6"  (167.6 cm) Weight: 52.2 kg (115 lb) IBW/kg (Calculated) : 59.3 Heparin Dosing Weight: 52.2 kg  Vital Signs: Temp: 98.4 F (36.9 C) (04/25 1453) Temp Source: Rectal (04/25 1453) BP: 104/71 (04/25 1600) Pulse Rate: 114 (04/25 1600)  Labs: Recent Labs    04/01/2021 1433  HGB 16.1*  HCT 49.4*  PLT 166  APTT 27  LABPROT 16.9*  INR 1.4*  CREATININE 1.45*    Estimated Creatinine Clearance: 31.5 mL/min (A) (by C-G formula based on SCr of 1.45 mg/dL (H)).   Medical History: Past Medical History:  Diagnosis Date  . Cancer [facility])     Medications:  (Not in a hospital admission)   Assessment: Pharmacy consulted to dose heparin in patient with pulmonary embolism.  Patient is not on anticoagulation prior to admission. Imaging positive for extensive right occlusive DVT involving calf veins, popliteal, femoral, and deep femoral veins.  Goal of Therapy:  Heparin level 0.3-0.7 units/ml Monitor platelets by anticoagulation protocol: Yes   Plan:  Give 3000 units bolus x 1 Start heparin infusion at 850 units/hr Check anti-Xa level in 6-8 hours and daily while on heparin Continue to monitor H&H and platelets   Ramond Craver 04/02/2021,4:28 PM

## 2021-04-14 NOTE — Sepsis Progress Note (Signed)
Notified provider of need to order repeat lactic acid. ° °

## 2021-04-14 NOTE — ED Triage Notes (Signed)
Patient from home with complaints of RLL swelling and pain. Denies any injury. Complaining of generalized pain. Per family, patient has dx of pancreatic cancer and has not been taking pain medication. Patient seems somewhat lethargic and reports that she may have taken too much of her Xanax last night. Noted with labored breathing.

## 2021-04-14 NOTE — Sepsis Progress Note (Signed)
Notified bedside nurse of need to draw repeat lactic acid. 

## 2021-04-14 NOTE — ED Notes (Signed)
Provider in room at time of triage to complete MSE.

## 2021-04-14 NOTE — Sepsis Progress Note (Signed)
Monitoring for code sepsis protocol. 

## 2021-04-14 NOTE — ED Notes (Signed)
Cleaned pt and put her in a brief

## 2021-04-14 NOTE — H&P (Addendum)
History and Physical    Ebony Gallagher IEP:[id number] DOB: March 22, 1956 DOA: 04/19/2021  PCP: Ebony Sill, NP (Confirm with patient/family/NH records and if not entered, this has to be entered at [facility] point of entry) Patient coming from: home  I have personally briefly reviewed patient's old medical records in [place]  Chief Complaint: Convusion, right leg swelling and pain.  HPI: Ebony Gallagher is a 65 y.o. 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 Patient followed by [facility] and is scheduled for MRCP and on-going evaluation and care. She continues to receive immunotherapy.  Patient has had increasing pain, increasing confusion that persisted even after removal of fentanyl patch. She has recently had MRI brain that was negative for mets. She has complex h/o PAD with right fem-pop bypass x 2, femoral endarterectomy right 2022 and previous h/o DVT. Over the past several days she has had increased swelling and pain in the right leg.  Due to her confusion, pain and swollen leg EMS was activated. They were not able to transport to [facility] and brought patient to [facility] for evaluation.   ED Course: T 97.8  112/74  HR 118  RR 37  VBG 7.35/29.6/49.0, Covid negative, Lactic acid 8.8 to 8.1. Na 130 BUN 73, Cr 1.45 AST 1,827, ALT 569, Alk Phos 978, T. Bili 5.4, Lipase 22, INR 1.4. WBC 22.7 w/ 97 neut/ 3 monos, Hgb 16.1. Venous doppler reveals extensive multi vessel DVT right leg. CTA negative for large vessel occlusion, bone mets noted, pulmonary nodules noted, new 9th rib fx noted.   Sepsis protocol initiated: IVF resuscitation done, Abx with cefepime, Vanc, Flagyl. Cultures drawn, U/A pending.   TRH called to admit for continued management  of sepsis with unknown origin, mgt of DVT and pain.  Review of Systems: As per HPI otherwise 10 point review of systems negative.    Past Medical History:  Diagnosis Date  . Adenocarcinoma ([place]) 2022   by liver bx - unknown primary  . Adrenal insufficiency due to cancer therapy ([place])   . Anxiety   . CAD (coronary artery disease)   . DM2 (diabetes mellitus, type 2) ([place])   . Fibromyalgia   . GERD (gastroesophageal reflux disease)   . History of bacterial pneumonia   . HLD (hyperlipidemia)   . HTN (hypertension)   . Hypothyroidism (acquired)    due to chemo  . Insomnia   . Malignant melanoma metastatic to bone ([place]) 2019   pulmonary nodules, small bowel, spine, NOT brain  . PAD (peripheral artery disease) ([place]) 2022   right LE - s/p endarterectomy femoral artery    Past Surgical History:  Procedure Laterality Date  . BREAST SURGERY    . CARPAL TUNNEL RELEASE  1990  . FEMORAL ENDARTERECTOMY Right 2022  . FEMORAL-POPLITEAL BYPASS GRAFT  2007, 2011  . hysterectomy    . TONSILLECTOMY      Social  Hx - married but now single. One daughter, one son, 3 grandchildren. Has been living with daughter. Worked at [employer] jobs.   reports that she has been smoking cigarettes. She has never used smokeless tobacco. She reports previous alcohol use. She reports that she does not use drugs.  Allergies  Allergen Reactions  . Codeine Rash  . Gabapentin     Makes pt feel funny  Family History  Problem Relation Age of Onset  . CVA Mother   . CAD Mother   . Hypertension Mother   . Colon polyps Mother   . Prostate cancer Father   . Hepatitis Sister   . Prostate cancer Paternal Uncle      Prior to Admission medications   Medication Sig Start Date End Date Taking? Authorizing Provider  ALPRAZolam Duanne Moron) 1 MG tablet Take 1 tablet by mouth 2 (two) times daily as needed. 05/30/20  Yes [provider]  atorvastatin (LIPITOR) 20 MG tablet Take 20 mg by mouth daily. 12/24/20  Yes  [provider]  clopidogrel (PLAVIX) 75 MG tablet Take 75 mg by mouth daily. 06/27/20  Yes [provider]  cyclobenzaprine (FLEXERIL) 10 MG tablet Take 10 mg by mouth 2 (two) times daily as needed for muscle spasms. 05/30/20  Yes [provider]  cycloSPORINE (RESTASIS) 0.05 % ophthalmic emulsion Place 1 drop into both eyes 2 (two) times daily. 02/19/20  Yes [provider]  fentaNYL (DURAGESIC) 50 MCG/HR Place 1 patch onto the skin every 3 (three) days. 03/31/21 04/30/21 Yes [provider]  fluticasone (FLONASE) 50 MCG/ACT nasal spray Place 1 spray into both nostrils daily. 06/27/20  Yes [provider]  hydrocortisone (CORTEF) 10 MG tablet Take 10 mg by mouth See admin instructions. Take 4 tablets in the morning and 3 tablets every evening 03/17/21  Yes [provider]  levothyroxine (SYNTHROID) 25 MCG tablet Take 25 mcg by mouth daily before breakfast. 02/27/21  Yes [provider]  lidocaine-prilocaine (EMLA) cream APPLY TOPICALLY ONCE FOR 1 DOSE. APPLY ONE HOUR PRIOR TO PROCEDURE 10/13/18  Yes [provider]  lisinopril (ZESTRIL) 10 MG tablet Take 10 mg by mouth daily. 05/30/18  Yes [provider]  metFORMIN (GLUCOPHAGE) 500 MG tablet Take 500 mg by mouth daily with breakfast. 02/06/21  Yes [provider]  naloxone (NARCAN) nasal spray 4 mg/0.1 mL Place 0.4 mg into the nose once. 03/14/21  Yes [provider]  nystatin (MYCOSTATIN) 100000 UNIT/ML suspension Use as directed 4 mLs in the mouth or throat 4 (four) times daily. 02/14/21  Yes [provider]  ondansetron (ZOFRAN) 4 MG tablet Take 4 mg by mouth every 8 (eight) hours as needed for nausea or vomiting. 08/15/18  Yes [provider]  oxyCODONE (OXY IR/ROXICODONE) 5 MG immediate release tablet Take 5 mg by mouth every 4 (four) hours as needed for breakthrough pain. 03/31/21 04/30/21 Yes [provider]  polyethylene  glycol powder (GLYCOLAX/MIRALAX) 17 GM/SCOOP powder Take 0.5 Containers by mouth daily.   Yes [provider]  predniSONE (DELTASONE) 10 MG tablet Take 10 mg by mouth daily with breakfast. 01/03/21  Yes [provider]  sucralfate (CARAFATE) 1 g tablet Take 1 g by mouth daily. 04/10/20  Yes [provider]  Acetaminophen-Caffeine 500-65 MG TABS Take by mouth. Patient not taking: Reported on 03/22/2021    [provider]  aspirin 81 MG EC tablet Take by mouth. Patient not taking: No sig reported    [provider]  Cholecalciferol 25 MCG (1000 UT) capsule Take by mouth. Patient not taking: No sig reported    [provider]  meloxicam (MOBIC) 15 MG tablet Take by mouth. Patient not taking: No sig reported 03/18/21   [provider]  pantoprazole (PROTONIX) 40 MG tablet Take 40 mg by mouth daily. 01/02/19   [provider]  potassium chloride SA (KLOR-CON) 20 MEQ tablet Take  by mouth. Patient not taking: Reported on 03/23/2021 10/05/19   [provider]  traMADol HCl 100 MG TABS Take by mouth. Patient not taking: Reported on 04/03/2021 01/24/20   [provider]  traZODone (DESYREL) 100 MG tablet 1/2 to 1 tablet at bedtime Patient not taking: Reported on 04/06/2021    [provider]  triamcinolone acetonide (KENALOG-40) 40 MG/ML injection (RADIOLOGY ONLY) Inject into the articular space. Patient not taking: Reported on 03/21/2021 11/12/20   [provider]  triamcinolone cream (KENALOG) 0.1 % APPLY TOPICALLY 2 (TWO) TIMES A DAY FOR UP TO 3 WEEKS IN A ROW Patient not taking: Reported on 04/10/2021 12/15/18   [provider]    Physical Exam: Vitals:   04/10/2021 1830 04/17/2021 1900 03/31/2021 1930 04/02/2021 2121  BP: 120/69 116/72 137/72   Pulse: (!) 107 (!) 105 (!) 108   Resp: (!) 29 (!) 27 20   Temp:    97.6 F (36.4 C)  TempSrc:    Oral  SpO2: 96% 96% 94%   Weight:    60.1 kg   Height:         Vitals:   03/25/2021 1830 04/10/2021 1900 04/03/2021 1930 04/02/2021 2121  BP: 120/69 116/72 137/72   Pulse: (!) 107 (!) 105 (!) 108   Resp: (!) 29 (!) 27 20   Temp:    97.6 F (36.4 C)  TempSrc:    Oral  SpO2: 96% 96% 94%   Weight:    60.1 kg  Height:       General: chronically ill appearing woman, confused, in pain Eyes: PERRL, lids and conjunctivae normal ENMT: Mucous membranes are dry. Normal dentition.  Neck: normal, supple, no masses, no thyromegaly Chest - porta-cath right upper chest Respiratory: decreased respiratory volume, decreased breath sounds at bases, clear to auscultation bilaterally, no wheezing, no crackles. Normal respiratory effort. No accessory muscle use.  Cardiovascular: Regular tachycardia, no murmurs / rubs / gallops. 3+ right lower extremity edema. 1+ pedal pulses. No carotid bruits.  Abdomen: Protuberant, tender to light palpation RUQ, epigastrum, question of palpable liver edge, Bowel sounds positive.  Musculoskeletal: no clubbing / cyanosis. No joint deformity upper and lower extremities.  no contractures. decreased muscle tone.  Skin: multiple bruises UE LE and abrasions LE, hyperemia toes but cool to touch Neurologic: CN 2-12 grossly intact. 4/5 in all 4.  Psychiatric: confused but able to orient, able to answer questions, voices understanding of context. Flat affect.     Labs on Admission: I have personally reviewed following labs and imaging studies  CBC: Recent Labs  Lab 04/13/2021 1433  WBC 22.7*  NEUTROABS 20.5*  HGB 16.1*  HCT 49.4*  MCV 89.7  PLT 505   Basic Metabolic Panel: Recent Labs  Lab 04/18/2021 1433  NA 130*  K 4.9  CL 95*  CO2 12*  GLUCOSE 171*  BUN 73*  CREATININE 1.45*  CALCIUM 8.4*   GFR: Estimated Creatinine Clearance: 35.7 mL/min (A) (by C-G formula based on SCr of 1.45 mg/dL (H)). Liver Function Tests: Recent Labs  Lab 03/22/2021 1433  AST 1,827*  ALT 569*  ALKPHOS 978*  BILITOT 5.4*  PROT  5.0*  ALBUMIN 2.6*   Recent Labs  Lab 04/05/2021 1433  LIPASE 32   No results for input(s): AMMONIA in the last 168 hours. Coagulation Profile: Recent Labs  Lab 04/13/2021 1433  INR 1.4*   Cardiac Enzymes: No results for input(s): CKTOTAL, CKMB, CKMBINDEX, TROPONINI in the last 168 hours. BNP (last  3 results) No results for input(s): PROBNP in the last 8760 hours. HbA1C: No results for input(s): HGBA1C in the last 72 hours. CBG: No results for input(s): GLUCAP in the last 168 hours. Lipid Profile: No results for input(s): CHOL, HDL, LDLCALC, TRIG, CHOLHDL, LDLDIRECT in the last 72 hours. Thyroid Function Tests: No results for input(s): TSH, T4TOTAL, FREET4, T3FREE, THYROIDAB in the last 72 hours. Anemia Panel: No results for input(s): VITAMINB12, FOLATE, FERRITIN, TIBC, IRON, RETICCTPCT in the last 72 hours. Urine analysis:    Component Value Date/Time   COLORURINE YELLOW 03/29/2021 1433   APPEARANCEUR CLOUDY (A) 03/29/2021 1433   LABSPEC 1.017 03/26/2021 1433   PHURINE 5.0 03/27/2021 1433   GLUCOSEU 150 (A) 04/06/2021 1433   HGBUR SMALL (A) 04/01/2021 1433   BILIRUBINUR NEGATIVE 04/17/2021 1433   KETONESUR NEGATIVE 04/01/2021 1433   PROTEINUR NEGATIVE 04/07/2021 1433   NITRITE NEGATIVE 04/12/2021 1433   LEUKOCYTESUR NEGATIVE 03/23/2021 1433    Radiological Exams on Admission: CT Angio Chest PE W and/or Wo Contrast  Result Date: 04/04/2021 CLINICAL DATA:  PE suspected, high prob Diagnosed with DVT today. EXAM: CT ANGIOGRAPHY CHEST WITH CONTRAST TECHNIQUE: Multidetector CT imaging of the chest was performed using the standard protocol during bolus administration of intravenous contrast. Multiplanar CT image reconstructions and MIPs were obtained to evaluate the vascular anatomy. Suboptimal contrast bolus, not repeated due to renal function. CONTRAST:  48m OMNIPAQUE IOHEXOL 350 MG/ML SOLN COMPARISON:  Radiograph earlier today. Report from noncontrast chest CT 03/07/2021,  images not available. FINDINGS: Cardiovascular: Contrast bolus is suboptimal for evaluation of pulmonary embolus. There are no obvious filling defects within the main and central pulmonary arteries, however evaluation of the distal lobar, segmental and subsegmental branches cannot be assessed. Heart is upper normal in size. There is lipomatous hypertrophy of the intra-atrial septum. Right chest port in place with tip in the SVC. Coronary artery calcifications. Small pericardial effusion. Aortic atherosclerosis without aneurysm. Conventional branching pattern from the aortic arch. Mediastinum/Nodes: Enlarged anterior lower paratracheal node measuring 15 mm short axis. There additional small mediastinal nodes are not enlarged by size criteria. Suspected enlarged right hilar node measuring 14 mm, series 5, image 44. Ill-defined right infrahilar soft tissue density may represent adenopathy, partially motion obscured. 13 mm subcarinal node. No esophageal wall thickening. Lungs/Pleura: Breathing motion artifact. There is narrowing of the right lower lobe bronchus with peribronchovascular soft tissue extending from the hilum to the perifissural lower lobe. Subsegmental atelectasis within both lower lobes. There is some perifissural nodularity in the lingula and left lower lobe, not well assessed due to motion, series 7, image 85. 12 mm nodule in the superior segment of the right lower lobe, series 7, image 49. The previous right middle lobe pulmonary nodule on outside report is not definitively seen on the current exam. Mild apical predominant emphysema. No significant pleural fluid. Upper Abdomen: Innumerable low-density liver lesions. Musculoskeletal: Multiple lucent lesions in the thoracic spine consistent with metastatic disease. There is a fracture of the posterior right sixth rib that may be pathologic, adjacent expansile lucent lesion in the T6 transverse process. Peripherally sclerotic lesion in the sternum.  Sclerotic focus within spinous process of T1. Review of the MIP images confirms the above findings. IMPRESSION: 1. Suboptimal contrast bolus for evaluation of pulmonary embolus. No obvious filling defects within the main and central pulmonary arteries, however evaluation of the distal lobar, segmental and subsegmental branches cannot be assessed. 2. Patient with known intrathoracic malignancy. Breathing motion artifact limits detailed assessment.  There is mediastinal and right hilar adenopathy as well as soft tissue density surrounding the right lower lobe bronchus. Similar findings were described on CT at outside institution last month. 3. A 12 mm nodule in the superior segment of the right lower lobe was reported at 11 mm on prior exam. Previously described right middle lobe nodule is not well seen currently. Bandlike soft tissue in the right lower lobe extends from the hilum to the perifissural lower lobe, possibly postobstructive with obliteration of the segmental bronchus. Linear opacities in the dependent lower lobes likely atelectasis. 4. Innumerable low-density liver lesions consistent with metastatic disease. 5. Osseous metastatic disease with multiple lucent lesions in the thoracic spine, ribs, and sternum. Fracture of the posterior right sixth rib that may be pathologic, adjacent expansile lucent lesion in the T6 transverse process. Aortic Atherosclerosis (ICD10-I70.0) and Emphysema (ICD10-J43.9). Electronically Signed   By: Keith Rake M.D.   On: 04/10/2021 19:25   US Venous Img Lower Bilateral (DVT)  Result Date: 03/28/2021 CLINICAL DATA:  Pain and edema EXAM: BILATERAL LOWER EXTREMITY VENOUS DOPPLER ULTRASOUND TECHNIQUE: Gray-scale sonography with compression, as well as color and duplex ultrasound, were performed to evaluate the deep venous system(s) from the level of the common femoral vein through the popliteal and proximal calf veins. COMPARISON:  None. FINDINGS: VENOUS On the right,  hypoechoic noncompressible occlusive thrombus in the deep femoral vein visualized segments in throughout the femoral vein and popliteal vein with minimal flow signal on color Doppler. Occluded peroneal, posterior tibial, and anterior tibial veins. The common femoral vein and saphenofemoral junction remain patent. On the left, normal compressibility of the common femoral, superficial femoral, and popliteal veins, as well as the visualized calf veins. Visualized portions of profunda femoral vein and great saphenous vein unremarkable. No filling defects to suggest DVT on grayscale or color Doppler imaging. Doppler waveforms show normal direction of venous flow, normal respiratory phasicity and response to augmentation. OTHER None. Limitations: none IMPRESSION: 1. POSITIVE for extensive right occlusive DVT involving calf veins, popliteal, femoral, and deep femoral veins. 2. No left lower extremity DVT. Electronically Signed   By: Lucrezia Europe M.D.   On: 03/25/2021 16:08   DG Chest Port 1 View  Result Date: 04/10/2021 CLINICAL DATA:  Sepsis. EXAM: PORTABLE CHEST 1 VIEW COMPARISON:  March 07, 2008. FINDINGS: The heart size and mediastinal contours are within normal limits. Right internal jugular Port-A-Cath is noted with tip in expected position of the SVC. No pneumothorax or pleural effusion is noted. Mild bibasilar subsegmental atelectasis is noted. The visualized skeletal structures are unremarkable. IMPRESSION: Mild bibasilar subsegmental atelectasis. Electronically Signed   By: Marijo Conception M.D.   On: 04/03/2021 15:04    EKG: Independently reviewed. Sinus tachycardia, few APC,   Assessment/Plan Active Problems:   PVD (peripheral vascular disease) (HCC)   Sepsis (HCC)   Neutrophilic leukocytosis   AKI (acute kidney injury) ([place])   Adenocarcinoma of unknown primary ([place])   Anxiety, generalized   DM type 2 with diabetic dyslipidemia (HCC)   Essential hypertension   Malignant melanoma of overlapping  sites [facility])   Adrenal insufficiency (HCC)   Drug-induced hypothyroidism   Dyslipidemia   GERD (gastroesophageal reflux disease)   Primary insomnia   Leg DVT (deep venous thromboembolism), acute, right ([place])    1. Severe Sepsis - patient presents with confusion, renal insufficiency, tachycardia, tachypnea, leukocytosis, elevated lactic acid. Source of infection unknown. Her cancer may be playing a role. No PNA on CTA Plan Admit  to step-down  Continue IV hydration  Continue abx with Vanc, Cefepime, Flagyl   F/u lab: CBCD, Bmet in AM  2. DVT - patient with massive CVT right LE in setting of PAD right femoral and previous DVT. Hypercoagulable due to malignancy likely. PE ruled out for LVO Plan IV heparin  Convert to coumadin as she stabilizes.  3. Oncology - advanced metastatic malignant melanoma with bone mets. Now with adenocarinoma unknown primary site with liver mets. Her oncologist is concerned for pancreatic primary and has her scheduled for MRCP. Spoke with on-call oncologist for [place]. Agrees with [facility]/AP admission to manage sepsis but does not feel we need to investigate malignancy at this time. When stable and discharged [place] center will continue eval and tx.  4. HTN-- stable BP. Patient with AKI, and tachycardia Plan Hold ACE-I  Metoprolol 25 mg bid  5. Hypothyroidism - acquired - continue home synthroid dose  6. Chemo related adrenal insufficiency - on steroid replacement Plan  IV solucortef in acute setting  Resume home regimen as she stabilizes.   7. GERD - continue home meds  8. DM2-  Plan Hold home oral meds  Sliding scale coverage  9. Anxiety - continue home dose of xanax  10. Pain mgt - bone met pain and abdominal pain Plan Resume Fentanyl patch  Oxy for breakthrough pain  11. End of life care - discussed in detail with patient and her daughter. Plan DNR/DNI  Referred to [email or web address] and [facility]  Consider hospice referral -  question of county of residence  60. AKI - sepsis associated AKI Plan IVH hydration  D/c ACE-I     DVT prophylaxis: full dose heparin  Code Status: DNR  Family Communication: Daughter at bedside. Understands Dx, gravity of the situation and Tx plan. Understands that oncology workup will be deferred to [facility]. Agrees with DNR status.   Disposition Plan: TBD  Consults called: none  Admission status: inpatient SDU    Adella Hare MD [facility] Pager [phone]  If 7PM-7AM, please contact night-coverage [email or web address] Password [facility]  03/30/2021, 9:30 PM

## 2021-04-14 NOTE — ED Notes (Signed)
Daughter would like to add a security code for pt information Ebony [facility] 1939

## 2021-04-15 ENCOUNTER — Inpatient Hospital Stay (HOSPITAL_COMMUNITY): Payer: Medicare HMO

## 2021-04-15 ENCOUNTER — Encounter (HOSPITAL_COMMUNITY): Payer: Self-pay | Admitting: Internal Medicine

## 2021-04-15 DIAGNOSIS — Z515 Encounter for palliative care: Secondary | ICD-10-CM | POA: Diagnosis not present

## 2021-04-15 DIAGNOSIS — C801 Malignant (primary) neoplasm, unspecified: Secondary | ICD-10-CM | POA: Diagnosis not present

## 2021-04-15 DIAGNOSIS — Z7189 Other specified counseling: Secondary | ICD-10-CM

## 2021-04-15 DIAGNOSIS — I10 Essential (primary) hypertension: Secondary | ICD-10-CM | POA: Diagnosis not present

## 2021-04-15 DIAGNOSIS — Z66 Do not resuscitate: Secondary | ICD-10-CM

## 2021-04-15 DIAGNOSIS — I82401 Acute embolism and thrombosis of unspecified deep veins of right lower extremity: Secondary | ICD-10-CM

## 2021-04-15 LAB — GLUCOSE, CAPILLARY
Glucose-Capillary: 112 mg/dL — ABNORMAL HIGH (ref 70–99)
Glucose-Capillary: 121 mg/dL — ABNORMAL HIGH (ref 70–99)

## 2021-04-15 LAB — COMPREHENSIVE METABOLIC PANEL
ALT: 711 U/L — ABNORMAL HIGH (ref 0–44)
AST: 3077 U/L — ABNORMAL HIGH (ref 15–41)
Albumin: 2.2 g/dL — ABNORMAL LOW (ref 3.5–5.0)
Alkaline Phosphatase: 867 U/L — ABNORMAL HIGH (ref 38–126)
Anion gap: 17 — ABNORMAL HIGH (ref 5–15)
BUN: 74 mg/dL — ABNORMAL HIGH (ref 8–23)
CO2: 17 mmol/L — ABNORMAL LOW (ref 22–32)
Calcium: 8 mg/dL — ABNORMAL LOW (ref 8.9–10.3)
Chloride: 100 mmol/L (ref 98–111)
Creatinine, Ser: 1.23 mg/dL — ABNORMAL HIGH (ref 0.44–1.00)
GFR, Estimated: 48 mL/min — ABNORMAL LOW (ref >60)
Glucose, Bld: 103 mg/dL — ABNORMAL HIGH (ref 70–99)
Potassium: 4.4 mmol/L (ref 3.5–5.1)
Sodium: 134 mmol/L — ABNORMAL LOW (ref 135–145)
Total Bilirubin: 6 mg/dL — ABNORMAL HIGH (ref 0.3–1.2)
Total Protein: 4.4 g/dL — ABNORMAL LOW (ref 6.5–8.1)

## 2021-04-15 LAB — LACTIC ACID, PLASMA
Lactic Acid, Venous: 5.5 mmol/L (ref 0.5–1.9)
Lactic Acid, Venous: 5.7 mmol/L (ref 0.5–1.9)
Lactic Acid, Venous: 8 mmol/L (ref 0.5–1.9)

## 2021-04-15 LAB — AMMONIA: Ammonia: 42 umol/L — ABNORMAL HIGH (ref 9–35)

## 2021-04-15 LAB — CBC
HCT: 46.7 % — ABNORMAL HIGH (ref 36.0–46.0)
Hemoglobin: 15.3 g/dL — ABNORMAL HIGH (ref 12.0–15.0)
MCH: 29.3 pg (ref 26.0–34.0)
MCHC: 32.8 g/dL (ref 30.0–36.0)
MCV: 89.3 fL (ref 80.0–100.0)
Platelets: 162 10*3/uL (ref 150–400)
RBC: 5.23 MIL/uL — ABNORMAL HIGH (ref 3.87–5.11)
RDW: 18 % — ABNORMAL HIGH (ref 11.5–15.5)
WBC: 20.1 10*3/uL — ABNORMAL HIGH (ref 4.0–10.5)
nRBC: 1.8 % — ABNORMAL HIGH (ref 0.0–0.2)

## 2021-04-15 LAB — HEMOGLOBIN A1C
Hgb A1c MFr Bld: 6.7 % — ABNORMAL HIGH (ref 4.8–5.6)
Mean Plasma Glucose: 145.59 mg/dL

## 2021-04-15 LAB — PROTIME-INR
INR: 1.5 — ABNORMAL HIGH (ref 0.8–1.2)
Prothrombin Time: 17.7 seconds — ABNORMAL HIGH (ref 11.4–15.2)

## 2021-04-15 LAB — HEPARIN LEVEL (UNFRACTIONATED): Heparin Unfractionated: 0.15 IU/mL — ABNORMAL LOW (ref 0.30–0.70)

## 2021-04-15 LAB — PROCALCITONIN: Procalcitonin: 15.32 ng/mL

## 2021-04-15 LAB — CORTISOL-AM, BLOOD: Cortisol - AM: >100 ug/dL — ABNORMAL HIGH (ref 6.7–22.6)

## 2021-04-15 LAB — MAGNESIUM: Magnesium: 2.3 mg/dL (ref 1.7–2.4)

## 2021-04-15 MED ORDER — ONDANSETRON 4 MG PO TBDP: 4.0000 mg | ORAL_TABLET | Freq: Four times a day (QID) | ORAL | Status: DC | PRN

## 2021-04-15 MED ORDER — GLYCOPYRROLATE 0.2 MG/ML IJ SOLN: 0.2000 mg | INTRAMUSCULAR | Status: DC | PRN

## 2021-04-15 MED ORDER — HEPARIN BOLUS VIA INFUSION
1500.0000 [IU] | Freq: Once | INTRAVENOUS | Status: AC
  Administered 2021-04-15: 1500 [IU] via INTRAVENOUS
  Filled 2021-04-15: qty 1500

## 2021-04-15 MED ORDER — ACETAMINOPHEN 325 MG PO TABS: 650.0000 mg | ORAL_TABLET | Freq: Four times a day (QID) | ORAL | Status: DC | PRN

## 2021-04-15 MED ORDER — SENNOSIDES-DOCUSATE SODIUM 8.6-50 MG PO TABS
2.0000 | ORAL_TABLET | Freq: Two times a day (BID) | ORAL | Status: DC
  Administered 2021-04-15: 2 via ORAL
  Filled 2021-04-15: qty 2

## 2021-04-15 MED ORDER — LORAZEPAM 2 MG/ML PO CONC: 1.0000 mg | ORAL | Status: DC | PRN

## 2021-04-15 MED ORDER — LORAZEPAM 2 MG/ML IJ SOLN: 1.0000 mg | INTRAMUSCULAR | Status: DC | PRN

## 2021-04-15 MED ORDER — MORPHINE SULFATE (PF) 2 MG/ML IV SOLN
2.0000 mg | INTRAVENOUS | Status: DC | PRN
  Administered 2021-04-15: 2 mg via INTRAVENOUS
  Filled 2021-04-15: qty 1

## 2021-04-15 MED ORDER — LACTULOSE 10 GM/15ML PO SOLN
10.0000 g | Freq: Two times a day (BID) | ORAL | Status: DC
  Filled 2021-04-15: qty 30

## 2021-04-15 MED ORDER — ACETAMINOPHEN 650 MG RE SUPP: 650.0000 mg | Freq: Four times a day (QID) | RECTAL | Status: DC | PRN

## 2021-04-15 MED ORDER — LORAZEPAM 2 MG/ML IJ SOLN
2.0000 mg | Freq: Two times a day (BID) | INTRAMUSCULAR | Status: DC
  Administered 2021-04-15: 2 mg via INTRAVENOUS
  Filled 2021-04-15: qty 1

## 2021-04-15 MED ORDER — CHLORHEXIDINE GLUCONATE CLOTH 2 % EX PADS
6.0000 | MEDICATED_PAD | Freq: Every day | CUTANEOUS | Status: DC
  Administered 2021-04-15: 6 via TOPICAL

## 2021-04-15 MED ORDER — BIOTENE DRY MOUTH MT LIQD: 15.0000 mL | OROMUCOSAL | Status: DC | PRN

## 2021-04-15 MED ORDER — SODIUM CHLORIDE 0.9 % IV SOLN: INTRAVENOUS | Status: DC

## 2021-04-15 MED ORDER — HALOPERIDOL 0.5 MG PO TABS: 0.5000 mg | ORAL_TABLET | ORAL | Status: DC | PRN

## 2021-04-15 MED ORDER — POLYVINYL ALCOHOL 1.4 % OP SOLN: 1.0000 [drp] | Freq: Four times a day (QID) | OPHTHALMIC | Status: DC | PRN

## 2021-04-15 MED ORDER — HYDROCORTISONE NA SUCCINATE PF 100 MG IJ SOLR
100.0000 mg | Freq: Three times a day (TID) | INTRAMUSCULAR | Status: DC
  Administered 2021-04-15: 100 mg via INTRAVENOUS
  Filled 2021-04-15: qty 2

## 2021-04-15 MED ORDER — LORAZEPAM 1 MG PO TABS: 1.0000 mg | ORAL_TABLET | ORAL | Status: DC | PRN

## 2021-04-15 MED ORDER — SODIUM CHLORIDE 0.9 % IV BOLUS
1000.0000 mL | Freq: Once | INTRAVENOUS | Status: AC
  Administered 2021-04-15: 1000 mL via INTRAVENOUS

## 2021-04-15 MED ORDER — HALOPERIDOL LACTATE 5 MG/ML IJ SOLN: 0.5000 mg | INTRAMUSCULAR | Status: DC | PRN

## 2021-04-15 MED ORDER — ONDANSETRON HCL 4 MG/2ML IJ SOLN: 4.0000 mg | Freq: Four times a day (QID) | INTRAMUSCULAR | Status: DC | PRN

## 2021-04-15 MED ORDER — HALOPERIDOL LACTATE 2 MG/ML PO CONC
0.5000 mg | ORAL | Status: DC | PRN
  Filled 2021-04-15: qty 0.3

## 2021-04-15 MED ORDER — GLYCOPYRROLATE 1 MG PO TABS: 1.0000 mg | ORAL_TABLET | ORAL | Status: DC | PRN

## 2021-04-15 MED ORDER — MINERAL OIL RE ENEM: 1.0000 | ENEMA | Freq: Every day | RECTAL | Status: DC | PRN

## 2021-04-15 MED ORDER — BISACODYL 10 MG RE SUPP: 10.0000 mg | Freq: Every day | RECTAL | Status: DC | PRN

## 2021-04-15 MED ORDER — MORPHINE 100MG IN NS 100ML (1MG/ML) PREMIX INFUSION
2.0000 mg/h | INTRAVENOUS | Status: DC
  Administered 2021-04-15: 2 mg/h via INTRAVENOUS
  Filled 2021-04-15: qty 100

## 2021-04-16 LAB — T4: T4, Total: 3.8 ug/dL — ABNORMAL LOW (ref 4.5–12.0)

## 2021-04-17 LAB — URINE CULTURE: Culture: 30000 — AB

## 2021-04-19 LAB — CULTURE, BLOOD (ROUTINE X 2)
Culture: NO GROWTH
Culture: NO GROWTH
Special Requests: ADEQUATE

## 2021-04-20 NOTE — Progress Notes (Signed)
PROGRESS NOTE    Ebony Gallagher  RCV:[id number] DOB: [birth year] DOA: 03/27/2021 PCP: Adaline Sill, NP (Confirm with patient/family/NH records and if not entered, this HAS to be entered at [facility] point of entry. "No PCP" if truly none.)   Chief Complaint  Patient presents with  . Leg Pain    Brief Narrative:  Ebony Gallagher is a 65 y.o. 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 On admission she was found to have elevated WBCs and procalcitonin and was initiated on sepsis protocol. Additionally, a venous doppler revealed extensive multi-vessel DVT in RLE but chest CTA did not find evidence of a PE. She was started on heparin infusion but lower limb ischemia is worsening.   Assessment & Plan:   Active Problems:   Adenocarcinoma of unknown primary ([place])   Adrenal insufficiency (HCC)   Anxiety, generalized   DM type 2 with diabetic dyslipidemia ([place])   Drug-induced hypothyroidism   Dyslipidemia   Essential hypertension   GERD (gastroesophageal reflux disease)   Malignant melanoma of overlapping sites ([place])   Primary insomnia   PVD (peripheral vascular disease) (HCC)   Sepsis ([place])   Leg DVT (deep venous thromboembolism), acute, right (HCC)   Neutrophilic leukocytosis   AKI (acute kidney injury) ([place])   DNR (do not resuscitate)  Profound Limb Ischemia - Extensive hx of CAD and PVD of lower limbs, including prior DVT and multiple procedures including 2x R femoral to popliteal bypass grafts and a R femoral endarterectomy - Lactic acid rapidly rising and limb actively becoming colder and more pale - Unable to perform ABI due to risk of embolic events - Currently experiencing  ssx most consistent with profound RLE limb ischemia - Continue Heparin Infusion  Leukocytosis - On presentation, WBC elevated >20 and procalcitonin >10 (per lab report: this value almost exclusively due to severe bacterial sepsis or septic shock) - Sepsis protocol initiated - Pt remains afebrile - Continue broad coverage with Vanc, Cefepime, and Flagyl - continue to trend WBCs with CBC w/Diff   Multi-site Malignant Melanoma - First noted 08/26/2018 with multiple nodules in GI tract c/w melanoma, high grade - Multiple rounds of chemotherapy - Nodule size increasing and spreading to sternum, spine, ribs, and lungs - Recent MRI shows no involvement of brain  Metastatic AdenoCa to Liver, Unknown Primary Location - Multiple bx confirm AdenoCa of unknown primary origin to the liver  Acute Delirium - Pt was AAOx4 with normal mood and affect documented on office visit on 4/22, over next 3 days became increasingly drowsy, confused, and disoriented. - Currently only oriented to self and location but not year with choices - Currently working up acute metabolic etiologies including liver or kidney failure, thyroid abnormalities, or electrolyte abnormalities.      - AST elevated to >3000 and ALT elevated > 711, Total Bili >6, most consistent with acute liver etiology     - Ammonia WNL, BUN elevated to 74  Transition to [place] - Daughter is Documented Healthcare Decision-Maker   DVT prophylaxis: (Lovenox/Heparin/SCD's/anticoagulated/None (if comfort care) Code Status: (Full/Partial - specify details) Family Communication: (Specify name, relationship & date discussed. NO "discussed with patient") Disposition:   Status is: Inpatient  Remains inpatient appropriate because:Hemodynamically unstable, Persistent severe electrolyte disturbances,  Ongoing active pain requiring inpatient pain management and Altered mental status   Dispo: The patient is from: Home              Anticipated d/c is  to: pending family discussion              Patient currently is not medically stable to d/c.   Difficult to place patient Yes       Consultants:  Quinn Axe NP with Palliative Care consulted 4/26, appreciate recs - She spoke with daughter, who is healthcare decision maker for pt - Per note, Impression:      - Full comfort care      - Hours to days      - Anticipate in-hospital death      - End-of-life order set implemented  Procedures:   No procedures as of 4/26  Antimicrobials:   Per sepsis protocol, Vanc, Cefepime, and Flagyl initiated on 4/26   Subjective: Pt subjectively more confused today than yesterday, complaining of pain in RLE. Pt fell asleep multiple times during interview this AM and eyelids were barely open when she was awake and tracking the provider.  Objective: Vitals:   29-Apr-2021 1157 2021-04-29 1215 2021-04-29 1300 2021-04-29 1400  BP:   127/76 128/62  Pulse:      Resp:  (!) 33 (!) 39   Temp: (!) 97.1 F (36.2 C)     TempSrc: Axillary     SpO2:      Weight:      Height:        Intake/Output Summary (Last 24 hours) at 2021-04-29 1429 Last data filed at 04-29-2021 1030 Gross per 24 hour  Intake 1845.06 ml  Output 1350 ml  Net 495.06 ml   Filed Weights   04/16/2021 1415 04/07/2021 2121  Weight: 52.2 kg 60.1 kg    Examination:  General exam: Appears calm, in NAD. Arousable to voice but very tired and unable to maintain conversation. Oriented to self but not place or year with options. Respiratory system: Clear to auscultation, shallow breaths. Respiratory effort normal. Cardiovascular system: S1 & S2 heard, RRR. No JVD, murmurs, rubs, gallops or clicks.  Gastrointestinal system: Abdomen is slightly distended. No organomegaly or masses felt but exam was limited by bowel pain during deep palpation over Upper and L Lower quadrants. Normal bowel sounds heard. Central nervous system: Alert and oriented. No focal neurological deficits. Extremities: Symmetric 5  x 5 power in BUE, +1 movement in LLE, +0 movement in RLE with profound tense edema, swelling, pallor, and intense pain during slight passive motion of knee. Skin: No rashes, lesions or ulcers Psychiatry: Mood & affect appropriate.     Data Reviewed: I have personally reviewed following labs and imaging studies  CBC: Recent Labs  Lab 03/23/2021 1433 04/29/21 0605  WBC 22.7* 20.1*  NEUTROABS 20.5*  --   HGB 16.1* 15.3*  HCT 49.4* 46.7*  MCV 89.7 89.3  PLT 166 0000000    Basic Metabolic Panel: Recent Labs  Lab 04/11/2021 1433 04/29/21 0605  NA 130* 134*  K 4.9 4.4  CL 95* 100  CO2 12* 17*  GLUCOSE 171* 103*  BUN 73* 74*  CREATININE 1.45* 1.23*  CALCIUM 8.4* 8.0*  MG  --  2.3    GFR: Estimated Creatinine Clearance: 42.1 mL/min (A) (by C-G formula based on SCr of 1.23 mg/dL (H)).  Liver Function Tests: Recent Labs  Lab 04/05/2021 1433 04/29/2021 0605  AST 1,827* 3,077*  ALT 569* 711*  ALKPHOS 978* 867*  BILITOT 5.4* 6.0*  PROT 5.0* 4.4*  ALBUMIN 2.6* 2.2*    CBG: Recent Labs  Lab 03/24/2021 2156 2021/04/27 0731 04-27-21 1200  GLUCAP 136* 112* 121*     Recent Results (from the past 240 hour(s))  Blood Culture (routine x 2)     Status: None (Preliminary result)   Collection Time: 04/04/2021  2:34 PM   Specimen: Porta Cath; Blood  Result Value Ref Range Status   Specimen Description PORTA CATH RIGHT  Final   Special Requests   Final    BOTTLES DRAWN AEROBIC AND ANAEROBIC Blood Culture adequate volume   Culture   Final    NO GROWTH < 24 HOURS Performed at [facility], [address], [place], [place] [address]    Report Status PENDING  Incomplete  Resp Panel by RT-PCR (Flu A&B, Covid) Nasopharyngeal Swab     Status: None   Collection Time: 04/04/2021  3:30 PM   Specimen: Nasopharyngeal Swab; Nasopharyngeal(NP) swabs in vial transport medium  Result Value Ref Range Status   SARS Coronavirus 2 by RT PCR NEGATIVE NEGATIVE Final    Comment: (NOTE) SARS-CoV-2 target  nucleic acids are NOT DETECTED.  The SARS-CoV-2 RNA is generally detectable in upper respiratory specimens during the acute phase of infection. The lowest concentration of SARS-CoV-2 viral copies this assay can detect is 138 copies/mL. A negative result does not preclude SARS-Cov-2 infection and should not be used as the sole basis for treatment or other patient management decisions. A negative result may occur with  improper specimen collection/handling, submission of specimen other than nasopharyngeal swab, presence of viral mutation(s) within the areas targeted by this assay, and inadequate number of viral copies(<138 copies/mL). A negative result must be combined with clinical observations, patient history, and epidemiological information. The expected result is Negative.  Fact Sheet for Patients:  EntrepreneurPulse.com.au  Fact Sheet for Healthcare Providers:  IncredibleEmployment.be  This test is no t yet approved or cleared by the [place] FDA and  has been authorized for detection and/or diagnosis of SARS-CoV-2 by FDA under an Emergency Use Authorization (EUA). This EUA will remain  in effect (meaning this test can be used) for the duration of the COVID-19 declaration under Section 564(b)(1) of the Act, 21 U.S.C.section 360bbb-3(b)(1), unless the authorization is terminated  or revoked sooner.       Influenza A by PCR NEGATIVE NEGATIVE Final   Influenza B by PCR NEGATIVE NEGATIVE Final    Comment: (NOTE) The Xpert Xpress SARS-CoV-2/FLU/RSV plus assay is intended as an aid in the diagnosis of influenza from Nasopharyngeal swab specimens and should not be used as a sole basis for treatment. Nasal washings and aspirates are unacceptable for Xpert Xpress SARS-CoV-2/FLU/RSV testing.  Fact Sheet for Patients: EntrepreneurPulse.com.au  Fact Sheet for Healthcare  Providers: IncredibleEmployment.be  This test is not yet approved or cleared by the [place] FDA and has been authorized for detection and/or diagnosis of SARS-CoV-2 by FDA under an Emergency Use Authorization (EUA). This EUA will remain in effect (meaning this test can be used) for the duration of the COVID-19 declaration under Section 564(b)(1) of the Act, 21 U.S.C. section 360bbb-3(b)(1), unless the authorization is terminated or revoked.  Performed at [facility], [address] [place],  [address]   Blood Culture (routine x 2)     Status: None (Preliminary result)   Collection Time: 04/14/21  3:46 PM   Specimen: BLOOD LEFT WRIST  Result Value Ref Range Status  Specimen Description BLOOD LEFT WRIST  Final   Special Requests   Final    BOTTLES DRAWN AEROBIC AND ANAEROBIC Blood Culture results may not be optimal due to an inadequate volume of blood received in culture bottles   Culture   Final    NO GROWTH < 24 HOURS Performed at [facility], [address], [place], [place] [address]    Report Status PENDING  Incomplete  MRSA PCR Screening     Status: None   Collection Time: 04/06/2021  9:40 PM   Specimen: Nasopharyngeal  Result Value Ref Range Status   MRSA by PCR NEGATIVE NEGATIVE Final    Comment:        The GeneXpert MRSA Assay (FDA approved for NASAL specimens only), is one component of a comprehensive MRSA colonization surveillance program. It is not intended to diagnose MRSA infection nor to guide or monitor treatment for MRSA infections. Performed at [facility], [address], [place], [place] [address]          Radiology Studies: CT HEAD WO CONTRAST  Result Date: 04-19-2021 CLINICAL DATA:  Mental status change EXAM: CT HEAD WITHOUT CONTRAST TECHNIQUE: Contiguous axial images were obtained from the base of the skull through the vertex without intravenous contrast. COMPARISON:  None. FINDINGS: Brain: Ventricle size and  cerebral volume normal. Mild white matter hypodensity in the frontal lobes bilaterally. Negative for acute infarct, acute hemorrhage, mass Vascular: Negative for hyperdense vessel Skull: Negative Sinuses/Orbits: Negative Other: None IMPRESSION: No acute intracranial abnormality. Mild white matter changes consistent with chronic microvascular ischemia. Electronically Signed   By: Franchot Gallo M.D.   On: 2021/04/19 12:29   CT Angio Chest PE W and/or Wo Contrast  Result Date: 04/11/2021 CLINICAL DATA:  PE suspected, high prob Diagnosed with DVT today. EXAM: CT ANGIOGRAPHY CHEST WITH CONTRAST TECHNIQUE: Multidetector CT imaging of the chest was performed using the standard protocol during bolus administration of intravenous contrast. Multiplanar CT image reconstructions and MIPs were obtained to evaluate the vascular anatomy. Suboptimal contrast bolus, not repeated due to renal function. CONTRAST:  53mL OMNIPAQUE IOHEXOL 350 MG/ML SOLN COMPARISON:  Radiograph earlier today. Report from noncontrast chest CT 03/07/2021, images not available. FINDINGS: Cardiovascular: Contrast bolus is suboptimal for evaluation of pulmonary embolus. There are no obvious filling defects within the main and central pulmonary arteries, however evaluation of the distal lobar, segmental and subsegmental branches cannot be assessed. Heart is upper normal in size. There is lipomatous hypertrophy of the intra-atrial septum. Right chest port in place with tip in the SVC. Coronary artery calcifications. Small pericardial effusion. Aortic atherosclerosis without aneurysm. Conventional branching pattern from the aortic arch. Mediastinum/Nodes: Enlarged anterior lower paratracheal node measuring 15 mm short axis. There additional small mediastinal nodes are not enlarged by size criteria. Suspected enlarged right hilar node measuring 14 mm, series 5, image 44. Ill-defined right infrahilar soft tissue density may represent adenopathy, partially  motion obscured. 13 mm subcarinal node. No esophageal wall thickening. Lungs/Pleura: Breathing motion artifact. There is narrowing of the right lower lobe bronchus with peribronchovascular soft tissue extending from the hilum to the perifissural lower lobe. Subsegmental atelectasis within both lower lobes. There is some perifissural nodularity in the lingula and left lower lobe, not well assessed due to motion, series 7, image 85. 12 mm nodule in the superior segment of the right lower lobe, series 7, image 49. The previous right middle lobe pulmonary nodule on outside report is not definitively seen on the current exam. Mild apical  predominant emphysema. No significant pleural fluid. Upper Abdomen: Innumerable low-density liver lesions. Musculoskeletal: Multiple lucent lesions in the thoracic spine consistent with metastatic disease. There is a fracture of the posterior right sixth rib that may be pathologic, adjacent expansile lucent lesion in the T6 transverse process. Peripherally sclerotic lesion in the sternum. Sclerotic focus within spinous process of T1. Review of the MIP images confirms the above findings. IMPRESSION: 1. Suboptimal contrast bolus for evaluation of pulmonary embolus. No obvious filling defects within the main and central pulmonary arteries, however evaluation of the distal lobar, segmental and subsegmental branches cannot be assessed. 2. Patient with known intrathoracic malignancy. Breathing motion artifact limits detailed assessment. There is mediastinal and right hilar adenopathy as well as soft tissue density surrounding the right lower lobe bronchus. Similar findings were described on CT at outside institution last month. 3. A 12 mm nodule in the superior segment of the right lower lobe was reported at 11 mm on prior exam. Previously described right middle lobe nodule is not well seen currently. Bandlike soft tissue in the right lower lobe extends from the hilum to the perifissural lower  lobe, possibly postobstructive with obliteration of the segmental bronchus. Linear opacities in the dependent lower lobes likely atelectasis. 4. Innumerable low-density liver lesions consistent with metastatic disease. 5. Osseous metastatic disease with multiple lucent lesions in the thoracic spine, ribs, and sternum. Fracture of the posterior right sixth rib that may be pathologic, adjacent expansile lucent lesion in the T6 transverse process. Aortic Atherosclerosis (ICD10-I70.0) and Emphysema (ICD10-J43.9). Electronically Signed   By: Keith Rake M.D.   On: 03/24/2021 19:25   US Venous Img Lower Bilateral (DVT)  Result Date: 04/13/2021 CLINICAL DATA:  Pain and edema EXAM: BILATERAL LOWER EXTREMITY VENOUS DOPPLER ULTRASOUND TECHNIQUE: Gray-scale sonography with compression, as well as color and duplex ultrasound, were performed to evaluate the deep venous system(s) from the level of the common femoral vein through the popliteal and proximal calf veins. COMPARISON:  None. FINDINGS: VENOUS On the right, hypoechoic noncompressible occlusive thrombus in the deep femoral vein visualized segments in throughout the femoral vein and popliteal vein with minimal flow signal on color Doppler. Occluded peroneal, posterior tibial, and anterior tibial veins. The common femoral vein and saphenofemoral junction remain patent. On the left, normal compressibility of the common femoral, superficial femoral, and popliteal veins, as well as the visualized calf veins. Visualized portions of profunda femoral vein and great saphenous vein unremarkable. No filling defects to suggest DVT on grayscale or color Doppler imaging. Doppler waveforms show normal direction of venous flow, normal respiratory phasicity and response to augmentation. OTHER None. Limitations: none IMPRESSION: 1. POSITIVE for extensive right occlusive DVT involving calf veins, popliteal, femoral, and deep femoral veins. 2. No left lower extremity DVT.  Electronically Signed   By: Lucrezia Europe M.D.   On: 03/24/2021 16:08   DG Chest Port 1 View  Result Date: 04/07/2021 CLINICAL DATA:  Sepsis. EXAM: PORTABLE CHEST 1 VIEW COMPARISON:  March 07, 2008. FINDINGS: The heart size and mediastinal contours are within normal limits. Right internal jugular Port-A-Cath is noted with tip in expected position of the SVC. No pneumothorax or pleural effusion is noted. Mild bibasilar subsegmental atelectasis is noted. The visualized skeletal structures are unremarkable. IMPRESSION: Mild bibasilar subsegmental atelectasis. Electronically Signed   By: Marijo Conception M.D.   On: 04/01/2021 15:04        Scheduled Meds: . cycloSPORINE  1 drop Both Eyes BID  . fentaNYL  1  patch Transdermal Q72H  . LORazepam  2 mg Intravenous BID  . nystatin  4 mL Mouth/Throat QID  . senna-docusate  2 tablet Oral BID   Continuous Infusions: . morphine       LOS: 1 day    Time spent: 1 hr    Modesta Messing, MD [facility]   To contact the attending provider between 7A-7P or the covering provider during after hours 7P-7A, please log into the web site [email or web address] and access using universal [place] password for that web site. If you do not have the password, please call the hospital operator.  05/12/21, 2:29 PM

## 2021-04-20 NOTE — Death Summary Note (Addendum)
DEATH SUMMARY   Patient Details  Name: Ebony Gallagher MRN: [id number] DOB: 01-12-1956  Admission/Discharge Information   Admit Date:  04/26/2021  Date of Death: Date of Death: 2021-04-27  Time of Death: Time of Death: 04-10-52  Length of Stay: 1  Referring Physician: Adaline Sill, NP   Reason(s) for Hospitalization  Acute metabolic encephalopathy with critical limb ischemia. Sepsis ruled out.  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 On admission she was found to have elevated WBCs and procalcitonin and was initiated on sepsis protocol. Additionally, a venous doppler revealed extensive multi-vessel DVT in RLE but chest CTA did not find evidence of a PE. She was started on heparin infusion but her lower limb ischemia continued to worsen as noted by her pain and  increasing lactic acidosis. She was not in stable condition for transfer to further evaluate with vascular surgery and given her increasing pain, instability, and poor prognosis, she was seen by Palliative Care who discussed comfort care with the family. She was already DNR on admission and did not want further aggressive measures per the daughter. She was transitioned to full comfort care and expired peacefully on 04/27/2021 at [facility].    Pertinent Labs and Studies  Significant Diagnostic Studies CT HEAD WO CONTRAST  Result Date: 04-27-21 CLINICAL DATA:  Mental status change EXAM: CT HEAD WITHOUT CONTRAST TECHNIQUE: Contiguous axial images were obtained from the base of the skull through the vertex without intravenous contrast. COMPARISON:  None. FINDINGS: Brain: Ventricle size and cerebral volume normal. Mild white matter hypodensity in the frontal lobes bilaterally. Negative for acute infarct, acute hemorrhage, mass Vascular: Negative for hyperdense vessel Skull: Negative Sinuses/Orbits: Negative Other: None IMPRESSION: No acute intracranial abnormality. Mild white matter changes consistent with chronic microvascular ischemia. Electronically Signed   By: Franchot Gallo M.D.   On: Apr 27, 2021 12:29   CT Angio Chest PE W and/or Wo Contrast  Result Date: Apr 26, 2021 CLINICAL DATA:  PE suspected, high prob Diagnosed with DVT today. EXAM: CT ANGIOGRAPHY CHEST WITH CONTRAST TECHNIQUE: Multidetector CT imaging of the chest was performed using the standard protocol during bolus administration of intravenous contrast. Multiplanar CT image reconstructions and MIPs were obtained to evaluate the vascular anatomy. Suboptimal contrast bolus, not repeated due to renal function. CONTRAST:  43mL OMNIPAQUE IOHEXOL 350 MG/ML SOLN COMPARISON:  Radiograph earlier today. Report from noncontrast chest CT 03/07/2021, images not available. FINDINGS: Cardiovascular: Contrast bolus is suboptimal for evaluation  of pulmonary embolus.  There are no obvious filling defects within the main and central pulmonary arteries, however evaluation of the distal lobar, segmental and subsegmental branches cannot be assessed. Heart is upper normal in size. There is lipomatous hypertrophy of the intra-atrial septum. Right chest port in place with tip in the SVC. Coronary artery calcifications. Small pericardial effusion. Aortic atherosclerosis without aneurysm. Conventional branching pattern from the aortic arch. Mediastinum/Nodes: Enlarged anterior lower paratracheal node measuring 15 mm short axis. There additional small mediastinal nodes are not enlarged by size criteria. Suspected enlarged right hilar node measuring 14 mm, series 5, image 44. Ill-defined right infrahilar soft tissue density may represent adenopathy, partially motion obscured. 13 mm subcarinal node. No esophageal wall thickening. Lungs/Pleura: Breathing motion artifact. There is narrowing of the right lower lobe bronchus with peribronchovascular soft tissue extending from the hilum to the perifissural lower lobe. Subsegmental atelectasis within both lower lobes. There is some perifissural nodularity in the lingula and left lower lobe, not well assessed due to motion, series 7, image 85. 12 mm nodule in the superior segment of the right lower lobe, series 7, image 49. The previous right middle lobe pulmonary nodule on outside report is not definitively seen on the current exam. Mild apical predominant emphysema. No significant pleural fluid. Upper Abdomen: Innumerable low-density liver lesions. Musculoskeletal: Multiple lucent lesions in the thoracic spine consistent with metastatic disease. There is a fracture of the posterior right sixth rib that may be pathologic, adjacent expansile lucent lesion in the T6 transverse process. Peripherally sclerotic lesion in the sternum. Sclerotic focus within spinous process of T1. Review of the MIP images confirms the above findings. IMPRESSION: 1.  Suboptimal contrast bolus for evaluation of pulmonary embolus. No obvious filling defects within the main and central pulmonary arteries, however evaluation of the distal lobar, segmental and subsegmental branches cannot be assessed. 2. Patient with known intrathoracic malignancy. Breathing motion artifact limits detailed assessment. There is mediastinal and right hilar adenopathy as well as soft tissue density surrounding the right lower lobe bronchus. Similar findings were described on CT at outside institution last month. 3. A 12 mm nodule in the superior segment of the right lower lobe was reported at 11 mm on prior exam. Previously described right middle lobe nodule is not well seen currently. Bandlike soft tissue in the right lower lobe extends from the hilum to the perifissural lower lobe, possibly postobstructive with obliteration of the segmental bronchus. Linear opacities in the dependent lower lobes likely atelectasis. 4. Innumerable low-density liver lesions consistent with metastatic disease. 5. Osseous metastatic disease with multiple lucent lesions in the thoracic spine, ribs, and sternum. Fracture of the posterior right sixth rib that may be pathologic, adjacent expansile lucent lesion in the T6 transverse process. Aortic Atherosclerosis (ICD10-I70.0) and Emphysema (ICD10-J43.9). Electronically Signed   By: Keith Rake M.D.   On: 04/13/2021 19:25   US Venous Img Lower Bilateral (DVT)  Result Date: 03/22/2021 CLINICAL DATA:  Pain and edema EXAM: BILATERAL LOWER EXTREMITY VENOUS DOPPLER ULTRASOUND TECHNIQUE: Gray-scale sonography with compression, as well as color and duplex ultrasound, were performed to evaluate the deep venous system(s) from the level of the common femoral vein through the popliteal and proximal calf veins. COMPARISON:  None. FINDINGS: VENOUS On the right, hypoechoic noncompressible occlusive thrombus in the deep femoral vein visualized segments in throughout the femoral  vein and popliteal vein with minimal flow signal on color Doppler. Occluded peroneal, posterior tibial, and anterior tibial veins. The common femoral vein and  saphenofemoral junction remain patent. On the left, normal compressibility of the common femoral, superficial femoral, and popliteal veins, as well as the visualized calf veins. Visualized portions of profunda femoral vein and great saphenous vein unremarkable. No filling defects to suggest DVT on grayscale or color Doppler imaging. Doppler waveforms show normal direction of venous flow, normal respiratory phasicity and response to augmentation. OTHER None. Limitations: none IMPRESSION: 1. POSITIVE for extensive right occlusive DVT involving calf veins, popliteal, femoral, and deep femoral veins. 2. No left lower extremity DVT. Electronically Signed   By: Lucrezia Europe M.D.   On: 04/10/2021 16:08   DG Chest Port 1 View  Result Date: 04/03/2021 CLINICAL DATA:  Sepsis. EXAM: PORTABLE CHEST 1 VIEW COMPARISON:  March 07, 2008. FINDINGS: The heart size and mediastinal contours are within normal limits. Right internal jugular Port-A-Cath is noted with tip in expected position of the SVC. No pneumothorax or pleural effusion is noted. Mild bibasilar subsegmental atelectasis is noted. The visualized skeletal structures are unremarkable. IMPRESSION: Mild bibasilar subsegmental atelectasis. Electronically Signed   By: Marijo Conception M.D.   On: 03/22/2021 15:04    Microbiology Recent Results (from the past 240 hour(s))  Urine culture     Status: Abnormal (Preliminary result)   Collection Time: 04/01/2021  2:33 PM   Specimen: Urine, Clean Catch  Result Value Ref Range Status   Specimen Description   Final    URINE, CLEAN CATCH Performed at [facility], [address], [place], [place] [address]    Special Requests   Final    NONE Performed at [facility], [address], [place], [place] [address]    Culture (A)  Final    30,000 COLONIES/mL STAPHYLOCOCCUS  EPIDERMIDIS SUSCEPTIBILITIES TO FOLLOW Performed at [facility], [place] [address] [place], [place] [address]    Report Status PENDING  Incomplete  Blood Culture (routine x 2)     Status: None (Preliminary result)   Collection Time: 04/16/2021  2:34 PM   Specimen: Porta Cath; Blood  Result Value Ref Range Status   Specimen Description PORTA CATH RIGHT  Final   Special Requests   Final    BOTTLES DRAWN AEROBIC AND ANAEROBIC Blood Culture adequate volume   Culture   Final    NO GROWTH 2 DAYS Performed at [facility], [address], [place],  [address]    Report Status PENDING  Incomplete  Resp Panel by RT-PCR (Flu A&B, Covid) Nasopharyngeal Swab     Status: None   Collection Time: 03/27/2021  3:30 PM   Specimen: Nasopharyngeal Swab; Nasopharyngeal(NP) swabs in vial transport medium  Result Value Ref Range Status   SARS Coronavirus 2 by RT PCR NEGATIVE NEGATIVE Final    Comment: (NOTE) SARS-CoV-2 target nucleic acids are NOT DETECTED.  The SARS-CoV-2 RNA is generally detectable in upper respiratory specimens during the acute phase of infection. The lowest concentration of SARS-CoV-2 viral copies this assay can detect is 138 copies/mL. A negative result does not preclude SARS-Cov-2 infection and should not be used as the sole basis for treatment or other patient management decisions. A negative result may occur with  improper specimen collection/handling, submission of specimen other than nasopharyngeal swab, presence of viral mutation(s) within the areas targeted by this assay, and inadequate number of viral copies(<138 copies/mL). A negative result must be combined with clinical observations, patient history, and epidemiological information. The expected result is Negative.  Fact Sheet for Patients:  EntrepreneurPulse.com.au  Fact Sheet for Healthcare  Providers:  IncredibleEmployment.be  This test is no t yet approved or cleared by  the [place] and  has been authorized for detection and/or diagnosis of SARS-CoV-2 by FDA under an Emergency Use Authorization (EUA). This EUA will remain  in effect (meaning this test can be used) for the duration of the COVID-19 declaration under Section 564(b)(1) of the Act, 21 U.S.C.section 360bbb-3(b)(1), unless the authorization is terminated  or revoked sooner.       Influenza A by PCR NEGATIVE NEGATIVE Final   Influenza B by PCR NEGATIVE NEGATIVE Final    Comment: (NOTE) The Xpert Xpress SARS-CoV-2/FLU/RSV plus assay is intended as an aid in the diagnosis of influenza from Nasopharyngeal swab specimens and should not be used as a sole basis for treatment. Nasal washings and aspirates are unacceptable for Xpert Xpress SARS-CoV-2/FLU/RSV testing.  Fact Sheet for Patients: EntrepreneurPulse.com.au  Fact Sheet for Healthcare Providers: IncredibleEmployment.be  This test is not yet approved or cleared by the [place] FDA and has been authorized for detection and/or diagnosis of SARS-CoV-2 by FDA under an Emergency Use Authorization (EUA). This EUA will remain in effect (meaning this test can be used) for the duration of the COVID-19 declaration under Section 564(b)(1) of the Act, 21 U.S.C. section 360bbb-3(b)(1), unless the authorization is terminated or revoked.  Performed at [facility], [address], [place], [place] [address]   Blood Culture (routine x 2)     Status: None (Preliminary result)   Collection Time: 04/07/2021  3:46 PM   Specimen: BLOOD LEFT WRIST  Result Value Ref Range Status   Specimen Description BLOOD LEFT WRIST  Final   Special Requests   Final    BOTTLES DRAWN AEROBIC AND ANAEROBIC Blood Culture results may not be optimal due to an inadequate volume of blood received in culture bottles   Culture   Final    NO GROWTH 2 DAYS Performed at [facility], [address] [place], [place] [address]     Report Status PENDING  Incomplete  MRSA PCR Screening     Status: None   Collection Time: 03/30/2021  9:40 PM   Specimen: Nasopharyngeal  Result Value Ref Range Status   MRSA by PCR NEGATIVE NEGATIVE Final    Comment:        The GeneXpert MRSA Assay (FDA approved for NASAL specimens only), is one component of a comprehensive MRSA colonization surveillance program. It is not intended to diagnose MRSA infection nor to guide or monitor treatment for MRSA infections. Performed at [facility], [address] [place], [place] [address]     Lab Basic Metabolic Panel: Recent Labs  Lab 04/10/2021 1433 May 12, 2021 0605  NA 130* 134*  K 4.9 4.4  CL 95* 100  CO2 12* 17*  GLUCOSE 171* 103*  BUN 73* 74*  CREATININE 1.45* 1.23*  CALCIUM 8.4* 8.0*  MG  --  2.3   Liver Function Tests: Recent Labs  Lab 04/16/2021 1433 May 12, 2021 0605  AST 1,827* 3,077*  ALT 569* 711*  ALKPHOS 978* 867*  BILITOT 5.4* 6.0*  PROT 5.0* 4.4*  ALBUMIN 2.6* 2.2*   Recent Labs  Lab 04/18/2021 1433  LIPASE 32   Recent Labs  Lab 05/12/21 0928  AMMONIA 42*   CBC: Recent Labs  Lab 04/10/2021 1433 2021-05-12 0605  WBC 22.7* 20.1*  NEUTROABS 20.5*  --   HGB 16.1* 15.3*  HCT 49.4* 46.7*  MCV 89.7 89.3  PLT 166 162   Cardiac Enzymes: No results for  input(s): CKTOTAL, CKMB, CKMBINDEX, TROPONINI in the last 168 hours. Sepsis Labs: Recent Labs  Lab 04-24-21 1433 2021-04-24 1736 04/24/2021 2315 04/02/2021 0605 03/27/2021 0836 04/11/2021 1051  PROCALCITON  --   --   --  15.32  --   --   WBC 22.7*  --   --  20.1*  --   --   LATICACIDVEN 8.8* 8.1* 5.5*  --  5.7* 8.0*    Procedures/Operations  See above.   Siaosi Alter D Manuella Ghazi 04/16/2021, 6:17 PM

## 2021-04-20 NOTE — Progress Notes (Signed)
Nutrition Brief Note  Chart reviewed. Pt now transitioning to comfort care.  No further nutrition interventions planned at this time.  Please re-consult as needed.   Lynn Camilia Caywood MS,RD,CSG,LDN Contact: [email or web address]   

## 2021-04-20 NOTE — Progress Notes (Signed)
60ml Morphine wasted with Greggory Keen

## 2021-04-20 NOTE — Consult Note (Signed)
Consultation Note Date: Apr 21, 2021   Patient Name: Ebony Gallagher  DOB: 21-Oct-1956  MRN: [id number]  Age / Sex: 65 y.o., female  PCP: Adaline Sill, NP Referring Physician: Rodena Goldmann, DO  Reason for Consultation: Establishing goals of care and Psychosocial/spiritual support  HPI/Patient Profile: 65 y.o. 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   Clinical Assessment and Goals of Care: I have reviewed medical records including EPIC notes, labs and imaging, received report from bedside nursing staff, examined the patient.  Ebony Gallagher is sitting up quietly in bed.  She will occasionally moan and call out.  She appears acutely/chronically ill and quite frail.  Her bilateral lower extremities are covered in scabs, redness, with purple toes.  She will briefly make but not keep eye contact.  She is oriented to self only at this time, and I am not sure that she can make her basic needs known.  There is no family at bedside at this time.  Bedside nursing staff and I talked about bowel regimen, implemented.  Later in the day, I met at bedside with daughter, Eula Flax and granddaughter Luvenia Starch to discuss diagnosis prognosis, [place], EOL wishes, disposition and options.   I introduced Palliative Medicine as specialized medical care for people living with serious illness. It focuses on providing relief from the symptoms and stress of a serious illness.   We discussed her current illness and what it means in the larger context of  her on-going co-morbidities.  Natural disease trajectory and expectations at EOL were discussed.   Evelena Peat and Luvenia Starch quickly and tearfully shares that she has made up her mind to focus on comfort and dignity for Ebony Gallagher.  Denyse Amass and Casar share that regardless of current treatment plan, Ebony Gallagher is suffering from a heavy cancer burden that we cannot change.  The difference between aggressive medical intervention and comfort care was considered in light of the patient's goals of care.  We talked about comfort measures only, what is and is not provided.  I share that we can prolong life, but can also prolong the dying process, and thereby prolong suffering.  Evelena Peat and Luvenia Starch are agreeable to stop IV fluids, IV antibiotics, painful procedures, lab draws, instead focusing on comfort and dignity, symptom management.  We talked about medicines for pain, anxiety, breathlessness.  They are agreeable to continuous infusion of morphine, and scheduled and as needed Ativan.  Advanced directives, concepts specific to code status, artifical feeding and hydration, and rehospitalization were considered and discussed.  Prognosis discussed with permission.  I share that as Ebony Gallagher comes closer to end-of-life her body will show signs and symptoms.  I share that 24 to 27  hours would be expected.  Hospice services outpatient were explained and offered, if Ebony Gallagher stabilizes.  I shared that I anticipate in hospital death  Questions and concerns were addressed.  The family was encouraged to call with questions or concerns.   Conference with attending, bedside nursing staff, transition of care team related to patient condition, needs, goals of care, disposition.    HCPOA   NEXT OF KIN -daughter, Lynnell Catalan    SUMMARY OF RECOMMENDATIONS   Full comfort care Hours to days Anticipate in-hospital death End-of-life order set implemented   Code Status/Advance Care Planning:  DNR  Symptom Management:   Per  hospitalist at this time.  Implement end-of-life order set when comfort care.  Palliative Prophylaxis:   Frequent Pain Assessment, Oral Care and Palliative Wound Care  Additional Recommendations (Limitations, Scope, Preferences):  Anticipate transition to comfort care after daughter arrives  Psycho-social/Spiritual:   Desire for further Chaplaincy support:yes  Additional Recommendations: Caregiving  Support/Resources, Education on Hospice and Grief/Bereavement Support  Prognosis:   < 2 weeks, could be sudden in hospital death.  Discharge Planning: At this point unsure if Ebony Gallagher would become stable for transfer out of the hospital      Primary Diagnoses: Present on Admission: . Adenocarcinoma of unknown primary ([place]) . Adrenal insufficiency ([place]) . Anxiety, generalized . DM type 2 with diabetic dyslipidemia ([place]) . Drug-induced hypothyroidism . Dyslipidemia . Essential hypertension . Malignant melanoma of overlapping sites [facility]) . Primary insomnia . PVD (peripheral vascular disease) ([place]) . Sepsis ([place]) . Leg DVT (deep venous thromboembolism), acute, right ([place]) . AKI (acute kidney injury) ([place])   I have reviewed the medical record, interviewed the patient and family, and examined the patient. The following aspects are pertinent.  Past Medical History:  Diagnosis Date  . Adenocarcinoma ([place]) 2022   by liver bx - unknown primary  . Adrenal insufficiency due to cancer therapy ([place])   . Anxiety   . CAD (coronary artery disease)   . DM2 (diabetes mellitus, type 2) ([place])   . Fibromyalgia   . GERD (gastroesophageal reflux disease)   . History of bacterial pneumonia   . HLD (hyperlipidemia)   . HTN (hypertension)   . Hypothyroidism (acquired)    due to chemo  . Insomnia   . Malignant melanoma metastatic to bone ([place]) 2019   pulmonary nodules, small bowel, spine, NOT brain  . PAD (peripheral artery disease) ([place]) 2022   right LE - s/p endarterectomy femoral  artery   Social History   Socioeconomic History  . Marital status: Married    Spouse name: Not on file  . Number of children: Not on file  . Years of education: Not on file  . Highest education level: Not on file  Occupational History  . Not on file  Tobacco Use  . Smoking status: Current Every Day Smoker    Types: Cigarettes  . Smokeless tobacco: Never Used  . Tobacco comment: 1-2 a day   Substance and Sexual Activity  . Alcohol use: Not Currently    Comment: Stopped drinking 6 months ago.  . Drug use: Never  . Sexual activity: Not Currently  Other Topics Concern  . Not on file  Social History Narrative  . Not on file   Social Determinants of Health   Financial Resource Strain: Not on file  Food Insecurity: Not on file  Transportation Needs: Not on file  Physical Activity: Not on file  Stress: Not on file  Social Connections:  Not on file   Family History  Problem Relation Age of Onset  . CVA Mother   . CAD Mother   . Hypertension Mother   . Colon polyps Mother   . Prostate cancer Father   . Hepatitis Sister   . Prostate cancer Paternal Uncle    Scheduled Meds: . Chlorhexidine Gluconate Cloth  6 each Topical Daily  . cycloSPORINE  1 drop Both Eyes BID  . feeding supplement  237 mL Oral BID BM  . fentaNYL  1 patch Transdermal Q72H  . heparin  1,500 Units Intravenous Once  . hydrocortisone sod succinate (SOLU-CORTEF) inj  100 mg Intravenous Q8H  . insulin aspart  0-15 Units Subcutaneous TID WC  . levothyroxine  25 mcg Oral QAC breakfast  . metoprolol tartrate  12.5 mg Oral BID  . nystatin  4 mL Mouth/Throat QID  . pantoprazole  40 mg Oral Daily  . senna  1 tablet Oral BID  . sucralfate  1 g Oral Daily   Continuous Infusions: . sodium chloride 125 mL/hr at May 09, 2021 0739  . ceFEPime (MAXIPIME) IV 200 mL/hr at 05/09/2021 0734  . heparin 850 Units/hr (05-09-21 0734)  . metronidazole Stopped (May 09, 2021 2951)  . vancomycin     PRN Meds:.acetaminophen **OR**  acetaminophen, ALPRAZolam, cyclobenzaprine, ondansetron, oxyCODONE Medications Prior to Admission:  Prior to Admission medications   Medication Sig Start Date End Date Taking? Authorizing Provider  ALPRAZolam Duanne Moron) 1 MG tablet Take 1 tablet by mouth 2 (two) times daily as needed. 05/30/20  Yes [provider]  atorvastatin (LIPITOR) 20 MG tablet Take 20 mg by mouth daily. 12/24/20  Yes [provider]  clopidogrel (PLAVIX) 75 MG tablet Take 75 mg by mouth daily. 06/27/20  Yes [provider]  cyclobenzaprine (FLEXERIL) 10 MG tablet Take 10 mg by mouth 2 (two) times daily as needed for muscle spasms. 05/30/20  Yes [provider]  cycloSPORINE (RESTASIS) 0.05 % ophthalmic emulsion Place 1 drop into both eyes 2 (two) times daily. 02/19/20  Yes [provider]  fentaNYL (DURAGESIC) 50 MCG/HR Place 1 patch onto the skin every 3 (three) days. 03/31/21 04/30/21 Yes [provider]  fluticasone (FLONASE) 50 MCG/ACT nasal spray Place 1 spray into both nostrils daily. 06/27/20  Yes [provider]  hydrocortisone (CORTEF) 10 MG tablet Take 10 mg by mouth See admin instructions. Take 4 tablets in the morning and 3 tablets every evening 03/17/21  Yes [provider]  levothyroxine (SYNTHROID) 25 MCG tablet Take 25 mcg by mouth daily before breakfast. 02/27/21  Yes [provider]  lidocaine-prilocaine (EMLA) cream APPLY TOPICALLY ONCE FOR 1 DOSE. APPLY ONE HOUR PRIOR TO PROCEDURE 10/13/18  Yes [provider]  lisinopril (ZESTRIL) 10 MG tablet Take 10 mg by mouth daily. 05/30/18  Yes [provider]  metFORMIN (GLUCOPHAGE) 500 MG tablet Take 500 mg by mouth daily with breakfast. 02/06/21  Yes [provider]  naloxone (NARCAN) nasal spray 4 mg/0.1 mL Place 0.4 mg into the nose once. 03/14/21  Yes [provider]  nystatin (MYCOSTATIN) 100000 UNIT/ML suspension Use as directed 4 mLs in the mouth or throat 4  (four) times daily. 02/14/21  Yes [provider]  ondansetron (ZOFRAN) 4 MG tablet Take 4 mg by mouth every 8 (eight) hours as needed for nausea or vomiting. 08/15/18  Yes [provider]  oxyCODONE (OXY IR/ROXICODONE) 5 MG immediate release tablet Take 5 mg by mouth every 4 (four) hours as needed for breakthrough  pain. 03/31/21 04/30/21 Yes [provider]  polyethylene glycol powder (GLYCOLAX/MIRALAX) 17 GM/SCOOP powder Take 0.5 Containers by mouth daily.   Yes [provider]  predniSONE (DELTASONE) 10 MG tablet Take 10 mg by mouth daily with breakfast. 01/03/21  Yes [provider]  sucralfate (CARAFATE) 1 g tablet Take 1 g by mouth daily. 04/10/20  Yes [provider]  Acetaminophen-Caffeine 500-65 MG TABS Take by mouth. Patient not taking: Reported on 04/13/2021    [provider]  aspirin 81 MG EC tablet Take by mouth. Patient not taking: No sig reported    [provider]  Cholecalciferol 25 MCG (1000 UT) capsule Take by mouth. Patient not taking: No sig reported    [provider]  meloxicam (MOBIC) 15 MG tablet Take by mouth. Patient not taking: No sig reported 03/18/21   [provider]  pantoprazole (PROTONIX) 40 MG tablet Take 40 mg by mouth daily. 01/02/19   [provider]  potassium chloride SA (KLOR-CON) 20 MEQ tablet Take by mouth. Patient not taking: Reported on 04/17/2021 10/05/19   [provider]  traMADol HCl 100 MG TABS Take by mouth. Patient not taking: Reported on 03/21/2021 01/24/20   [provider]  traZODone (DESYREL) 100 MG tablet 1/2 to 1 tablet at bedtime Patient not taking: Reported on 04/10/2021    [provider]  triamcinolone acetonide (KENALOG-40) 40 MG/ML injection (RADIOLOGY ONLY) Inject into the articular space. Patient not taking: Reported on 04/03/2021 11/12/20   [provider]  triamcinolone cream (KENALOG) 0.1 % APPLY TOPICALLY  2 (TWO) TIMES A DAY FOR UP TO 3 WEEKS IN A ROW Patient not taking: Reported on 03/24/2021 12/15/18   [provider]   Allergies  Allergen Reactions  . Codeine Rash  . Gabapentin     Makes pt feel funny   Review of Systems  Unable to perform ROS: Other    Physical Exam Vitals and nursing note reviewed.  Constitutional:      General: She is in acute distress.     Appearance: She is ill-appearing.  HENT:     Head: Atraumatic.     Mouth/Throat:     Mouth: Mucous membranes are dry.  Cardiovascular:     Rate and Rhythm: Normal rate.  Pulmonary:     Effort: Pulmonary effort is normal. No respiratory distress.  Abdominal:     Tenderness: There is no abdominal tenderness. There is no guarding.  Skin:    General: Skin is warm and dry.     Findings: Bruising, erythema and lesion present.  Neurological:     Mental Status: She is alert.     Comments: Self only     Vital Signs: BP 123/75   Pulse (!) 104   Temp (!) 97.1 F (36.2 C) (Axillary)   Resp (!) 40   Ht 5' 6"  (1.676 m)   Wt 60.1 kg   SpO2 95%   BMI 21.39 kg/m  Pain Scale: 0-10   Pain Score: 0-No pain   SpO2: SpO2: 95 % O2 Device:SpO2: 95 % O2 Flow Rate: .   IO: Intake/output summary:   Intake/Output Summary (Last 24 hours) at 05/12/2021 0814 Last data filed at May 12, 2021 0734 Gross per 24 hour  Intake 1845.06 ml  Output --  Net 1845.06 ml    LBM: Last BM Date: 04/13/21 Baseline Weight: Weight: 52.2 kg Most recent weight: Weight: 60.1 kg     Palliative Assessment/Data:   Flowsheet Rows   Flowsheet  Row Most Recent Value  Intake Tab   Referral Department Hospitalist  Unit at Time of Referral Intermediate Care Unit  Palliative Care Primary Diagnosis Cancer  Date Notified Apr 29, 2021  Palliative Care Type New Palliative care  Reason for referral Clarify Goals of Care  Date of Admission 04/02/2021  Date first seen by Palliative Care Apr 29, 2021  # of days Palliative referral response time 0 Day(s)   # of days IP prior to Palliative referral 1  Clinical Assessment   Palliative Performance Scale Score 30%  Pain Max last 24 hours Not able to report  Pain Min Last 24 hours Not able to report  Dyspnea Max Last 24 Hours Not able to report  Dyspnea Min Last 24 hours Not able to report  Psychosocial & Spiritual Assessment   Palliative Care Outcomes       Time In: 0900 Time Out: 1020 Time Total: 80 minutes  Greater than 50%  of this time was spent counseling and coordinating care related to the above assessment and plan.  Signed by: Drue Novel, NP   Please contact Palliative Medicine Team phone at [phone] for questions and concerns.  For individual provider: See Shea Evans

## 2021-04-20 NOTE — Progress Notes (Signed)
ANTICOAGULATION CONSULT NOTE - Follow Up Consult  Pharmacy Consult for IV Heparin Indication: DVT/possible PE  Allergies  Allergen Reactions  . Codeine Rash  . Gabapentin     Makes pt feel funny    Patient Measurements: Height: 5\' 6"  (167.6 cm) Weight: 60.1 kg (132 lb 7.9 oz) IBW/kg (Calculated) : 59.3 Heparin Dosing Weight: 60 kg  Vital Signs: Temp: 97.1 F (36.2 C) (04/26 0728) Temp Source: Axillary (04/26 0728) BP: 123/75 (04/26 0700) Pulse Rate: 104 (04/26 0000)  Labs: Recent Labs    2021/04/18 1433 04-18-2021 2045 04/05/2021 0605  HGB 16.1*  --  15.3*  HCT 49.4*  --  46.7*  PLT 166  --  162  APTT 27  --   --   LABPROT 16.9*  --  17.7*  INR 1.4*  --  1.5*  HEPARINUNFRC  --  <0.10* 0.15*  CREATININE 1.45*  --   --     Estimated Creatinine Clearance: 35.7 mL/min (A) (by C-G formula based on SCr of 1.45 mg/dL (H)).   Medications:  Scheduled:  . Chlorhexidine Gluconate Cloth  6 each Topical Daily  . cycloSPORINE  1 drop Both Eyes BID  . feeding supplement  237 mL Oral BID BM  . fentaNYL  1 patch Transdermal Q72H  . hydrocortisone sod succinate (SOLU-CORTEF) inj  100 mg Intravenous Q8H  . insulin aspart  0-15 Units Subcutaneous TID WC  . levothyroxine  25 mcg Oral QAC breakfast  . metoprolol tartrate  12.5 mg Oral BID  . nystatin  4 mL Mouth/Throat QID  . pantoprazole  40 mg Oral Daily  . senna  1 tablet Oral BID  . sucralfate  1 g Oral Daily   Infusions:  . sodium chloride 125 mL/hr at 04/18/2021 0739  . ceFEPime (MAXIPIME) IV 200 mL/hr at 04/12/2021 0734  . heparin 850 Units/hr (03/27/2021 0734)  . metronidazole Stopped (04/10/2021 0539)  . vancomycin      Assessment:  Patient is a 65 yo F who was diagnosed with DVT earlier this evening and started on heparin infusion.    Heparin resumed at previous rate of 850 units/hr.  HL 0.15  Goal of Therapy:  Heparin level 0.3-0.7 units/ml Monitor platelets by anticoagulation protocol: Yes   Plan:  Rebolus  heparin 1500 units IV x 1 dose. Increase heparin infusion to 1000 units/hr. Heparin level in 6-8 hours. Continue to monitor H&H and s/s of bleeding.  Margot Ables, PharmD Clinical Pharmacist 03/26/2021 7:51 AM

## 2021-04-20 DEATH — deceased

## 2022-03-25 IMAGING — CT CT ANGIO CHEST
2 of 6 series · 17 of 36 positions shown · IV contrast (Omnipaque or Isovue)
Comparison: Radiograph earlier today. Report from noncontrast chest
CT 03/07/2021, images not available.

CLINICAL DATA: PE suspected, high prob

Diagnosed with DVT today.
EXAM:
CT ANGIOGRAPHY CHEST WITH CONTRAST
TECHNIQUE: Multidetector CT imaging of the chest was performed using the
standard protocol during bolus administration of intravenous
contrast. Multiplanar CT image reconstructions and MIPs were
obtained to evaluate the vascular anatomy.
Suboptimal contrast bolus, not repeated due to renal function.
CONTRAST:  75mL OMNIPAQUE IOHEXOL 350 MG/ML SOLN

[Series 6: pe axial thins · axial · 0.61mm/px · z∈[-127,+120]mm · 16 of 343 slices shown]
[im 17/343  lung]
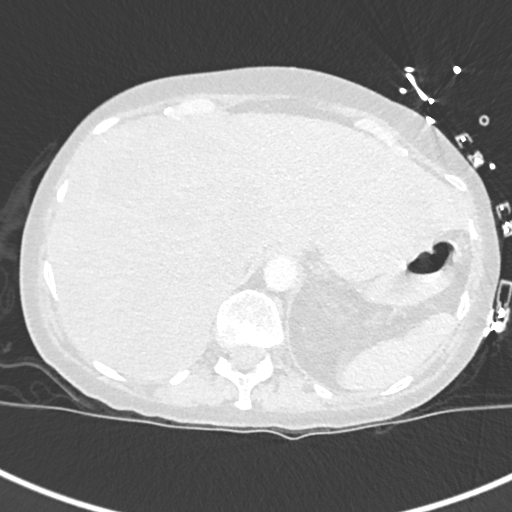
[im 33/343  mediastinal]
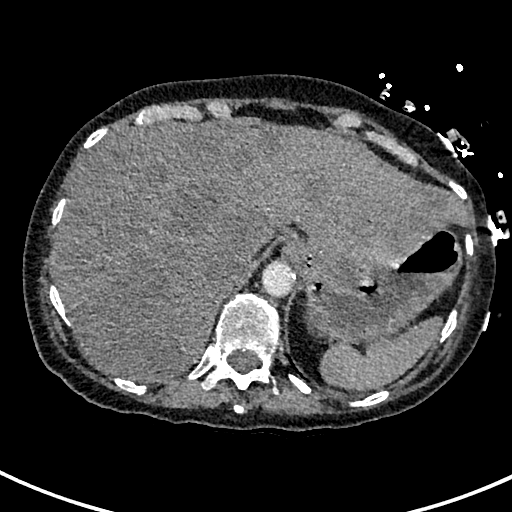
[im 66/343  lung]
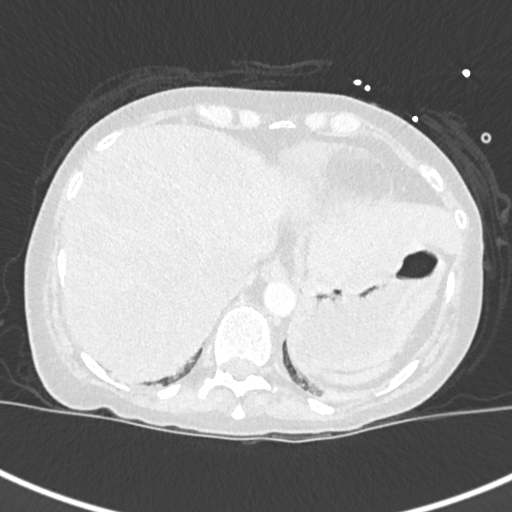
[im 82/343  mediastinal]
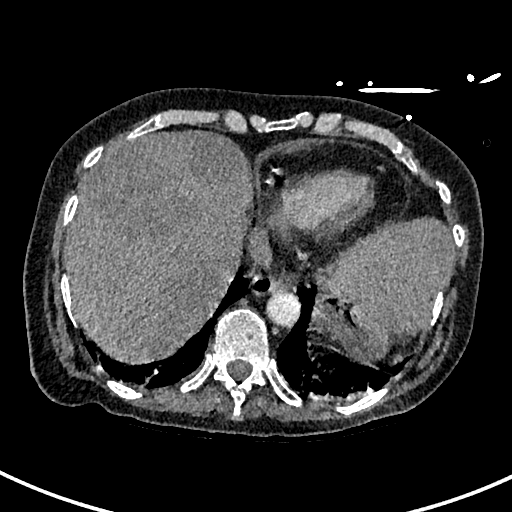
[im 98/343  lung]
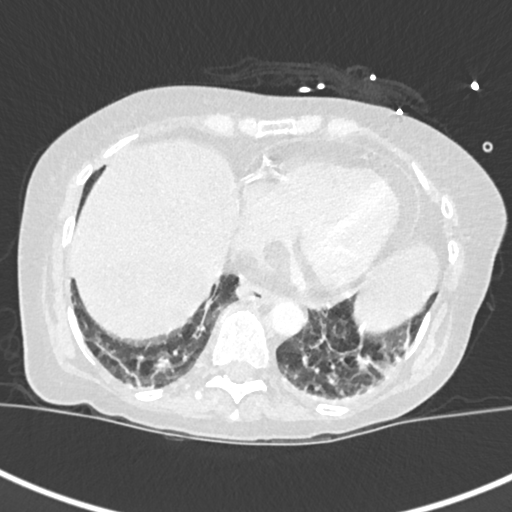
[im 115/343  mediastinal]
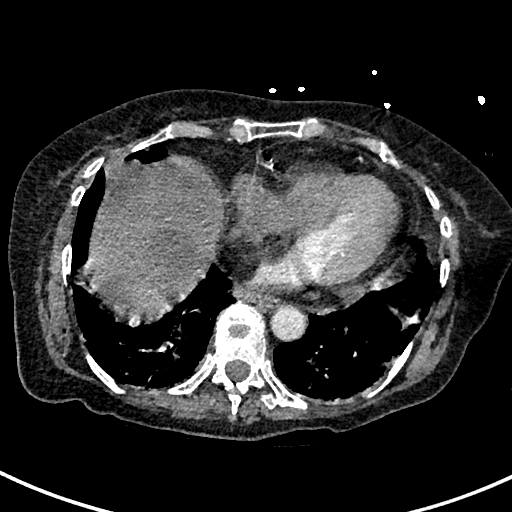
[im 147/343  lung]
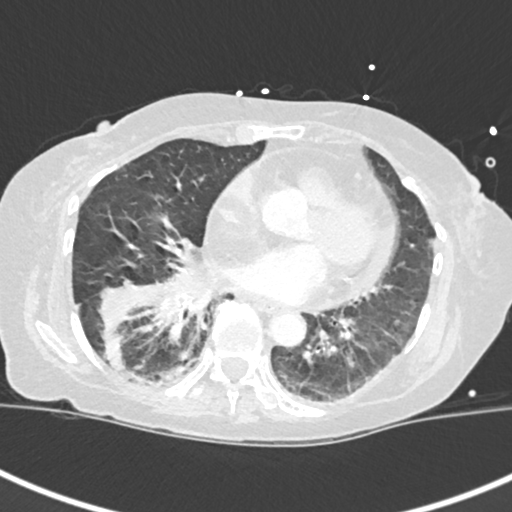
[im 163/343  mediastinal]
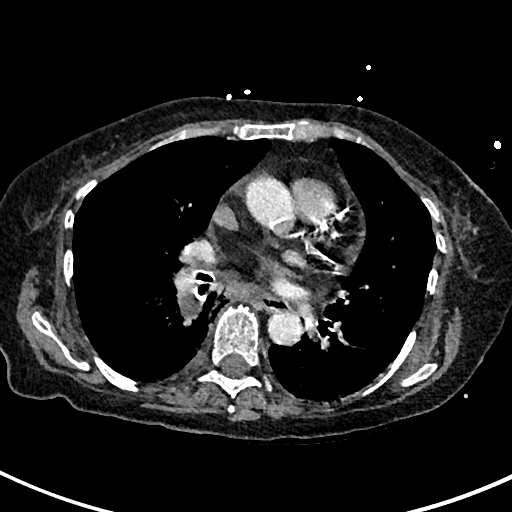
[im 180/343  lung]
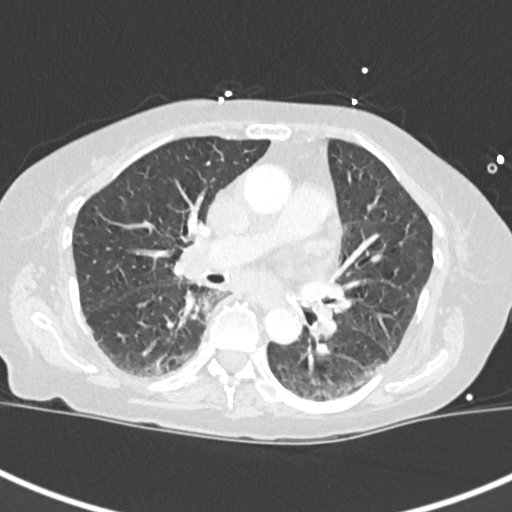
[im 196/343  mediastinal]
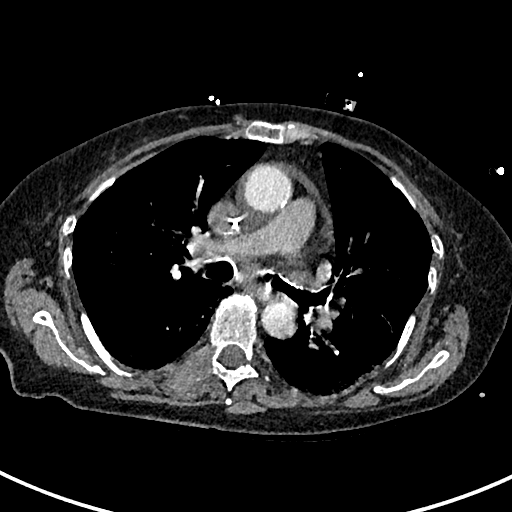
[im 229/343  lung]
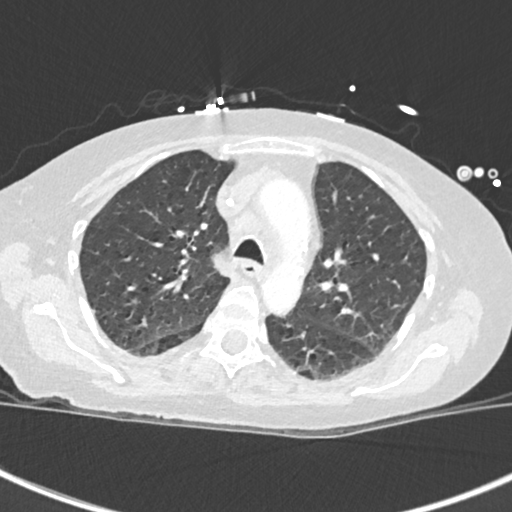
[im 245/343  mediastinal]
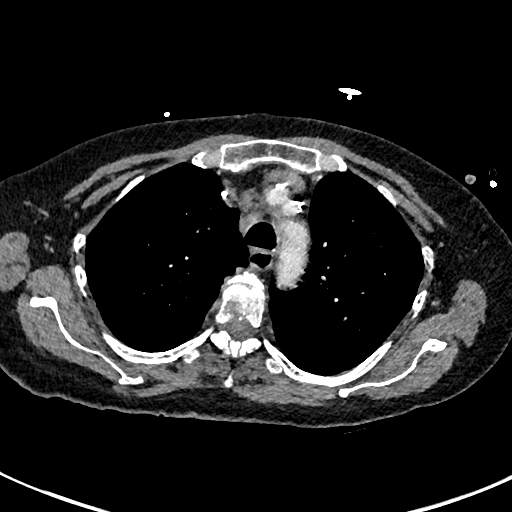
[im 261/343  lung]
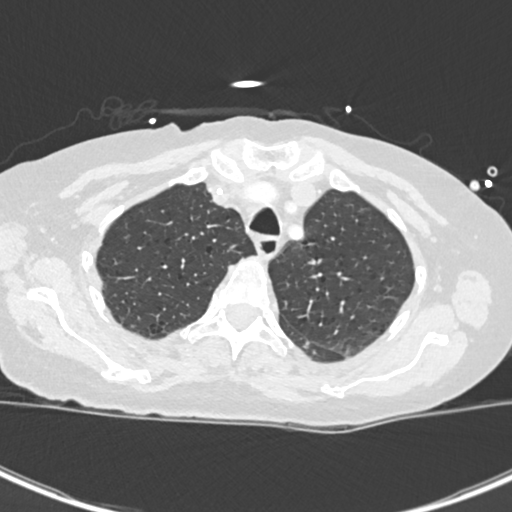
[im 277/343  mediastinal]
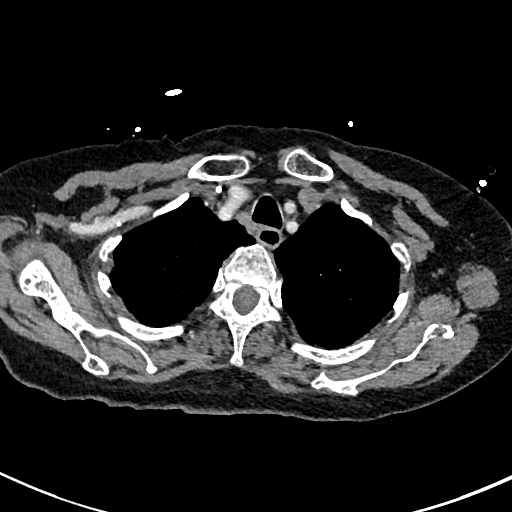
[im 310/343  lung]
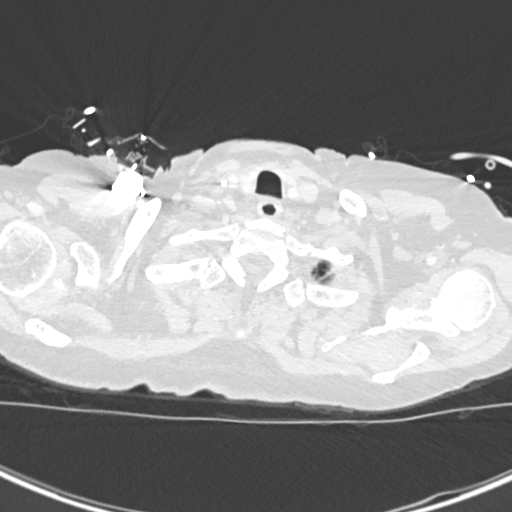
[im 326/343  mediastinal]
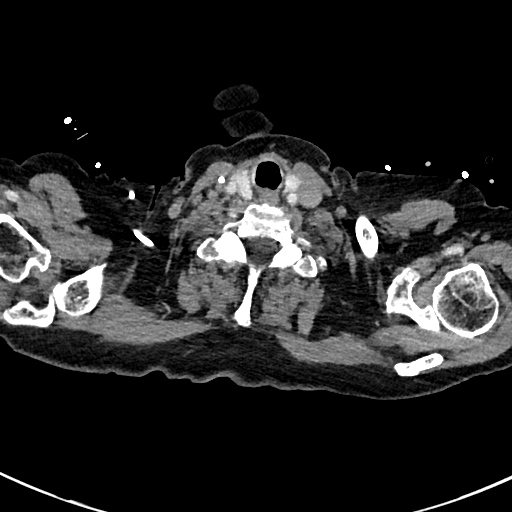

[Series 8: cor soft · coronal · 0.53mm/px · 1 of 123 slices shown]
[im 62/123  mediastinal]
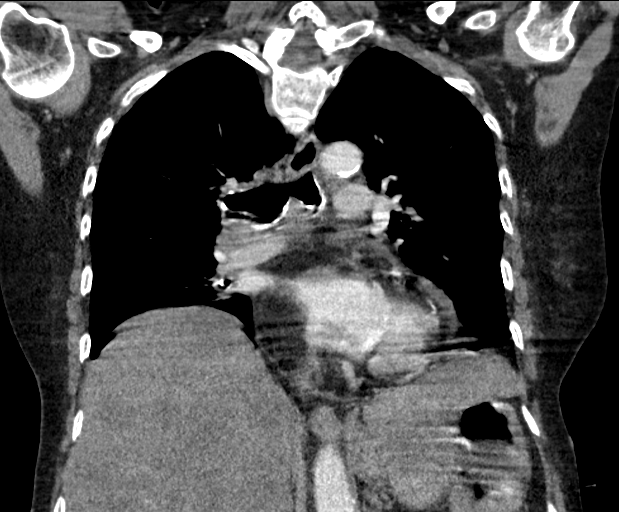

[17 of 36 positions shown; findings below may reference images not displayed]

FINDINGS: Cardiovascular: Contrast bolus is suboptimal for evaluation of
pulmonary embolus. There are no obvious filling defects within the
main and central pulmonary arteries, however evaluation of the
distal lobar, segmental and subsegmental branches cannot be
assessed. Heart is upper normal in size. There is lipomatous
hypertrophy of the intra-atrial septum. Right chest port in place
with tip in the SVC. Coronary artery calcifications. Small
pericardial effusion. Aortic atherosclerosis without aneurysm.
Conventional branching pattern from the aortic arch.

Mediastinum/Nodes: Enlarged anterior lower paratracheal node
measuring 15 mm short axis. There additional small mediastinal nodes
are not enlarged by size criteria. Suspected enlarged right hilar
node measuring 14 mm, series 5, image 44. Ill-defined right
infrahilar soft tissue density may represent adenopathy, partially
motion obscured. 13 mm subcarinal node. No esophageal wall
thickening.

Lungs/Pleura: Breathing motion artifact. There is narrowing of the
right lower lobe bronchus with peribronchovascular soft tissue
extending from the hilum to the perifissural lower lobe.
Subsegmental atelectasis within both lower lobes. There is some
perifissural nodularity in the lingula and left lower lobe, not well
assessed due to motion, series 7, image 85. 12 mm nodule in the
superior segment of the right lower lobe, series 7, image 49. The
previous right middle lobe pulmonary nodule on outside report is not
definitively seen on the current exam. Mild apical predominant
emphysema. No significant pleural fluid.

Upper Abdomen: Innumerable low-density liver lesions.

Musculoskeletal: Multiple lucent lesions in the thoracic spine
consistent with metastatic disease. There is a fracture of the
posterior right sixth rib that may be pathologic, adjacent expansile
lucent lesion in the T6 transverse process. Peripherally sclerotic
lesion in the sternum. Sclerotic focus within spinous process of T1.

Review of the MIP images confirms the above findings.
IMPRESSION: 1. Suboptimal contrast bolus for evaluation of pulmonary embolus. No
obvious filling defects within the main and central pulmonary
arteries, however evaluation of the distal lobar, segmental and
subsegmental branches cannot be assessed.
2. Patient with known intrathoracic malignancy. Breathing motion
artifact limits detailed assessment. There is mediastinal and right
hilar adenopathy as well as soft tissue density surrounding the
right lower lobe bronchus. Similar findings were described on CT at
outside institution last month.
3. A 12 mm nodule in the superior segment of the right lower lobe
was reported at 11 mm on prior exam. Previously described right
middle lobe nodule is not well seen currently. Bandlike soft tissue
in the right lower lobe extends from the hilum to the perifissural
lower lobe, possibly postobstructive with obliteration of the
segmental bronchus. Linear opacities in the dependent lower lobes
likely atelectasis.
4. Innumerable low-density liver lesions consistent with metastatic
disease.
5. Osseous metastatic disease with multiple lucent lesions in the
thoracic spine, ribs, and sternum. Fracture of the posterior right
sixth rib that may be pathologic, adjacent expansile lucent lesion
in the T6 transverse process.

Aortic Atherosclerosis (5EUGQ-51I.I) and Emphysema (5EUGQ-[id number].P).

## 2022-03-25 IMAGING — DX DG CHEST 1V PORT
1 series · 1 of 1 positions shown · non-contrast
Comparison: March 07, 2008.

CLINICAL DATA: Sepsis.

EXAM:
PORTABLE CHEST 1 VIEW

[chest ap]
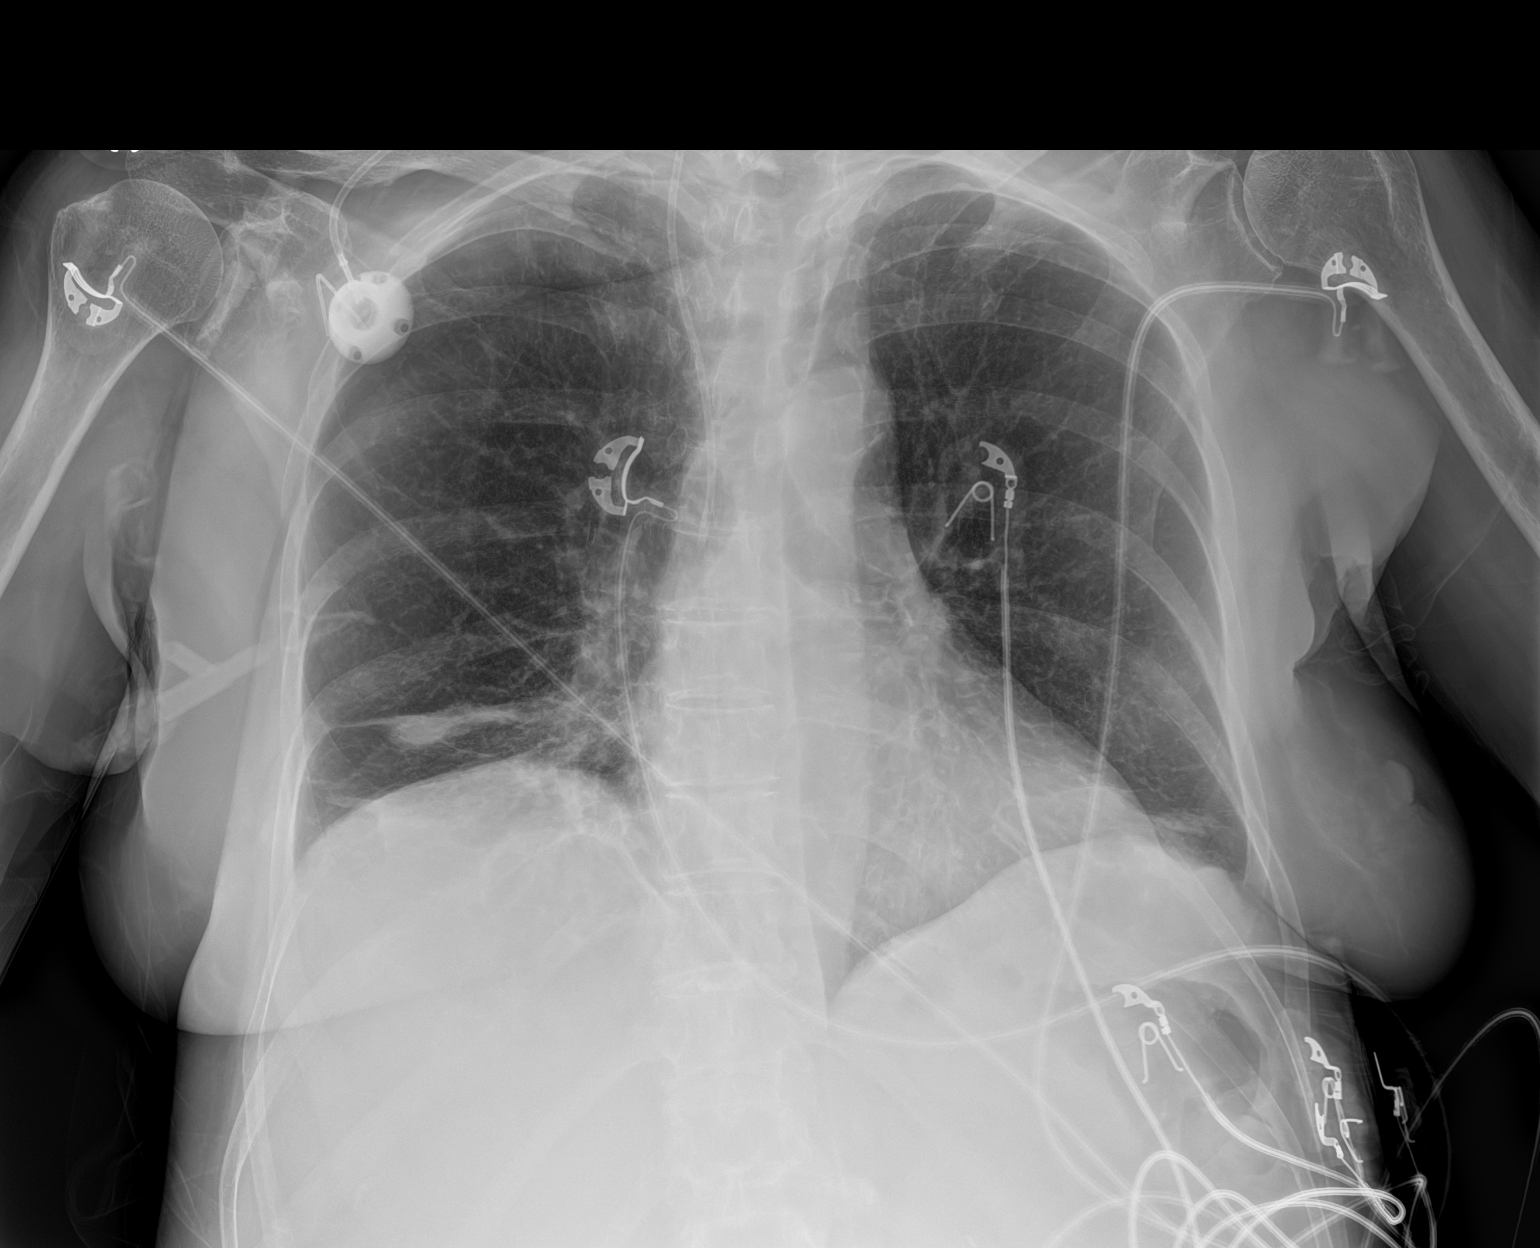

[1 of 1 positions shown; findings below may reference images not displayed]

FINDINGS: The heart size and mediastinal contours are within normal limits.
Right internal jugular Port-A-Cath is noted with tip in expected
position of the SVC. No pneumothorax or pleural effusion is noted.
Mild bibasilar subsegmental atelectasis is noted. The visualized
skeletal structures are unremarkable.
IMPRESSION: Mild bibasilar subsegmental atelectasis.
